# Patient Record
Sex: Male | Born: 1935
Health system: Southern US, Community
[De-identification: ages and names within clinical notes are randomized; demographics above are authoritative.]

## PROBLEM LIST (undated history)

## (undated) DIAGNOSIS — I8390 Asymptomatic varicose veins of unspecified lower extremity: Secondary | ICD-10-CM

## (undated) DIAGNOSIS — I872 Venous insufficiency (chronic) (peripheral): Secondary | ICD-10-CM

## (undated) DIAGNOSIS — I7090 Unspecified atherosclerosis: Secondary | ICD-10-CM

## (undated) HISTORY — DX: Venous insufficiency (chronic) (peripheral): I87.2

## (undated) HISTORY — DX: Asymptomatic varicose veins of unspecified lower extremity: I83.90

## (undated) HISTORY — DX: Unspecified atherosclerosis: I70.90

## (undated) HISTORY — PX: HERNIA REPAIR: SHX51

---

## 1974-01-15 HISTORY — PX: APPENDECTOMY: SHX54

## 2013-03-12 ENCOUNTER — Other Ambulatory Visit (HOSPITAL_COMMUNITY): Payer: Self-pay

## 2013-03-18 ENCOUNTER — Other Ambulatory Visit (HOSPITAL_COMMUNITY): Payer: Self-pay | Admitting: Family Medicine

## 2013-03-18 ENCOUNTER — Ambulatory Visit (HOSPITAL_COMMUNITY)
Admission: RE | Admit: 2013-03-18 | Discharge: 2013-03-18 | Disposition: A | Discharge status: Home / Self Care | Payer: Medicare Other | Source: Ambulatory Visit | Attending: Vascular Surgery | Admitting: Vascular Surgery

## 2013-03-18 DIAGNOSIS — I739 Peripheral vascular disease, unspecified: Secondary | ICD-10-CM

## 2013-03-26 ENCOUNTER — Other Ambulatory Visit: Payer: Self-pay | Admitting: *Deleted

## 2013-03-26 DIAGNOSIS — I739 Peripheral vascular disease, unspecified: Secondary | ICD-10-CM

## 2013-04-03 ENCOUNTER — Encounter: Payer: Self-pay | Admitting: Vascular Surgery

## 2013-04-15 ENCOUNTER — Encounter: Payer: Self-pay | Admitting: Vascular Surgery

## 2013-04-16 ENCOUNTER — Ambulatory Visit (INDEPENDENT_AMBULATORY_CARE_PROVIDER_SITE_OTHER): Payer: Medicare Other | Admitting: Vascular Surgery

## 2013-04-16 ENCOUNTER — Encounter: Payer: Self-pay | Admitting: Vascular Surgery

## 2013-04-16 ENCOUNTER — Ambulatory Visit (HOSPITAL_COMMUNITY)
Admission: RE | Admit: 2013-04-16 | Discharge: 2013-04-16 | Disposition: A | Discharge status: Home / Self Care | Payer: Medicare Other | Source: Ambulatory Visit | Attending: Vascular Surgery | Admitting: Vascular Surgery

## 2013-04-16 VITALS — BP 181/73 | HR 69 | Ht 71.0 in | Wt 188.0 lb

## 2013-04-16 DIAGNOSIS — I739 Peripheral vascular disease, unspecified: Secondary | ICD-10-CM

## 2013-04-16 NOTE — Progress Notes (Signed)
[facility] [place] HISTORY AND PHYSICAL   History of Present Illness:  Patient is a 80 y.o. year old male who presents for evaluation of left leg claudication. The patient is able to walk 2-3 blocks reports preventing tightness and cramping in his left calf. This is been the one on for approximately 3 months. He denies rest pain. He denies nonhealing wounds on the feet. He does have a history of varicose veins and has been evaluated by Dr. Michae Kava for this. Other medical problems include hypertension and borderline cholesterol.  He does not have a history of tobacco abuse or diabetes.  Past Medical History  Diagnosis Date  . Varicose veins   . Atherosclerotic vascular disease   . Venous insufficiency     History reviewed. No pertinent past surgical history.  Social History History  Substance Use Topics  . Smoking status: Never Smoker   . Smokeless tobacco: Never Used  . Alcohol Use: No    Family History Family History  Problem Relation Age of Onset  . Hypertension Mother   . Heart disease Father     before age 35  . Heart attack Father   . Heart disease Brother   . Hyperlipidemia Brother   . Heart attack Brother     Allergies  Allergies  Allergen Reactions  . Penicillins Hypertension  . Tetracyclines & Related Hives  . Vancomycin     Upset stomach     Current Outpatient Prescriptions  Medication Sig Dispense Refill  . Acetylcarnitine HCl (ACETYL-L-CARNITINE HCL) POWD by Does not apply route.      . ALBUTEROL IN Inhale into the lungs.      . Ascorbic Acid (VITAMIN C PO) Take by mouth.      . Carnitine-B5-B6 500-15-5 MG TABS Take 2 tablets by mouth daily.      . Cholecalciferol (VITAMIN D-3 PO) Take by mouth.      . Coenzyme Q10 (EQL COQ10) 300 MG CAPS Take by mouth daily.      . Fish Oil-Cholecalciferol (FISH OIL + D3 PO) Take by mouth.      Marland Kitchen GINKGO BILOBA PO Take by mouth.      Marland Kitchen MAGNESIUM PO Take by mouth.      . Menaquinone-7  (VITAMIN K2 PO) Take by mouth.      . Nattokinase 100 MG CAPS Take by mouth daily.      Marland Kitchen VITAMIN E PO Take by mouth.       No current facility-administered medications for this visit.    ROS:   General:  No weight loss, Fever, chills  HEENT: No recent headaches, no nasal bleeding, no visual changes, no sore throat  Neurologic: No dizziness, blackouts, seizures. No recent symptoms of stroke or mini- stroke. No recent episodes of slurred speech, or temporary blindness.  Cardiac: No recent episodes of chest pain/pressure, no shortness of breath at rest.  No shortness of breath with exertion.  Denies history of atrial fibrillation or irregular heartbeat  Vascular: No history of rest pain in feet.  + history of claudication.  No history of non-healing ulcer, No history of DVT   Pulmonary: No home oxygen, no productive cough, no hemoptysis,  No asthma or wheezing  Musculoskeletal:  [ ]  Arthritis, [ ]  Low back pain,  [ ]  Joint pain  Hematologic:No history of hypercoagulable state.  No history of easy bleeding.  No history of anemia  Gastrointestinal: No hematochezia or melena,  No gastroesophageal reflux, no trouble  swallowing  Urinary: [ ]  chronic Kidney disease, [ ]  on HD - [ ]  MWF or [ ]  TTHS, [ ]  Burning with urination, [ ]  Frequent urination, [ ]  Difficulty urinating;   Skin: No rashes  Psychological: No history of anxiety,  No history of depression   Physical Examination  Filed Vitals:   04/16/13 1125  BP: 181/73  Pulse: 69  Height: 5\' 11"  (1.803 m)  Weight: 188 lb (85.276 kg)  SpO2: 100%    Body mass index is 26.23 kg/(m^2).  General:  Alert and oriented, no acute distress HEENT: Normal Neck: No bruit or JVD Pulmonary: Clear to auscultation bilaterally Cardiac: Regular Rate and Rhythm without murmur Abdomen: Soft, non-tender, non-distended, no mass, no scars Skin: No rash Extremity Pulses:  2+ radial, brachial, 2+ right femoral, 1+ left femoral absent popliteal  dorsalis pedis, posterior tibial pulses bilaterally Musculoskeletal: No deformity or edema  Neurologic: Upper and lower extremity motor 5/5 and symmetric  DATA:  The patient had bilateral ABIs performed right was 0.7 left 0.5. I reviewed and interpreted this study.   ASSESSMENT:  Left lower extremity claudication with evidence of bilateral peripheral arterial occlusive disease lower extremities. I had a lengthy discussion with the patient today that overall his risk of amputation of his left lower extremity is extremely low less than 5%. This is especially in light of the fact he does not smoke or have diabetes. I also discussed with the patient today a walking program of 30 minutes daily to improve his walking distance overall. The other option I gave him was to consider an intervention with arteriogram possible angioplasty and stenting. He most likely has bilateral superficial femoral occlusive disease and possible left iliac occlusive disease   PLAN:  At this point the patient has opted for walking program. He will try to walk 30 minutes daily to improve his walking distance. If he changes his mind we will consider an arterial intervention at some point in the future. Otherwise he will followup with me with repeat ABIs and arterial duplex in 6 months time. Patient will continue his followup for his varicose veins with Dr. Wandra Mannan, MD [facility] [place] Office: [phone] Pager: [phone]

## 2013-10-15 ENCOUNTER — Encounter (HOSPITAL_COMMUNITY): Payer: Medicare Other

## 2013-10-15 ENCOUNTER — Ambulatory Visit: Payer: Medicare Other | Admitting: Vascular Surgery

## 2013-10-28 ENCOUNTER — Encounter: Payer: Self-pay | Admitting: Vascular Surgery

## 2013-10-29 ENCOUNTER — Encounter: Payer: Self-pay | Admitting: Vascular Surgery

## 2013-10-29 ENCOUNTER — Ambulatory Visit (INDEPENDENT_AMBULATORY_CARE_PROVIDER_SITE_OTHER): Payer: Medicare Other | Admitting: Vascular Surgery

## 2013-10-29 ENCOUNTER — Ambulatory Visit (HOSPITAL_COMMUNITY)
Admission: RE | Admit: 2013-10-29 | Discharge: 2013-10-29 | Disposition: A | Discharge status: Home / Self Care | Payer: Medicare Other | Source: Ambulatory Visit | Attending: Vascular Surgery | Admitting: Vascular Surgery

## 2013-10-29 VITALS — BP 167/66 | HR 65 | Ht 71.0 in | Wt 191.3 lb

## 2013-10-29 DIAGNOSIS — Z48812 Encounter for surgical aftercare following surgery on the circulatory system: Secondary | ICD-10-CM

## 2013-10-29 DIAGNOSIS — I739 Peripheral vascular disease, unspecified: Secondary | ICD-10-CM | POA: Insufficient documentation

## 2013-10-29 NOTE — Addendum Note (Signed)
Addended by: Mena Goes on: 10/29/2013 04:55 PM   Modules accepted: Orders

## 2013-10-29 NOTE — Progress Notes (Addendum)
HISTORY AND PHYSICAL     CC:  F/u for PAD  Earney Mallet, MD  HPI: This is a 80 y.o. male who presents today for evaluation of his PAD.  He states that he can walk about 0.29mi before he has to stop and rest and the pain goes away.  His pain is worse in his left leg.  He denies rest pain and does not have a hx of non healing wounds.  He states that he does have an ingrown toenail on his right foot and is inquiring about a podiatrist.  He does not take medication for HTN or cholesterol.  He states that he lives healthy and takes supplements and eats a Mediterranean diet.  He has never smoked.    Past Medical History  Diagnosis Date  . Varicose veins   . Atherosclerotic vascular disease   . Venous insufficiency    No past surgical history on file.  Allergies  Allergen Reactions  . Penicillins Hypertension  . Tetracyclines & Related Hives  . Vancomycin     Upset stomach    Current Outpatient Prescriptions  Medication Sig Dispense Refill  . Acetylcarnitine HCl (ACETYL-L-CARNITINE HCL) POWD by Does not apply route.      . ALBUTEROL IN Inhale into the lungs.      . Ascorbic Acid (VITAMIN C PO) Take by mouth.      . Carnitine-B5-B6 500-15-5 MG TABS Take 2 tablets by mouth daily.      . Cholecalciferol (VITAMIN D-3 PO) Take by mouth.      . Coenzyme Q10 (EQL COQ10) 300 MG CAPS Take by mouth daily.      . Fish Oil-Cholecalciferol (FISH OIL + D3 PO) Take by mouth.      Marland Kitchen GINKGO BILOBA PO Take by mouth.      Marland Kitchen MAGNESIUM PO Take by mouth.      . Menaquinone-7 (VITAMIN K2 PO) Take by mouth.      . Nattokinase 100 MG CAPS Take by mouth daily.      Marland Kitchen VITAMIN E PO Take by mouth.       No current facility-administered medications for this visit.    Family History  Problem Relation Age of Onset  . Hypertension Mother   . Heart disease Mother   . Heart disease Father     before age 50  . Heart attack Father   . Heart disease Brother   . Hyperlipidemia Brother   . Heart attack  Brother     History   Social History  . Marital Status: Married    Spouse Name: N/A    Number of Children: N/A  . Years of Education: N/A   Occupational History  . Not on file.   Social History Main Topics  . Smoking status: Never Smoker   . Smokeless tobacco: Never Used  . Alcohol Use: No  . Drug Use: No  . Sexual Activity: Not on file   Other Topics Concern  . Not on file   Social History Narrative  . No narrative on file     ROS: [x]  Positive   [ ]  Negative   [ ]  All sytems reviewed and are negative  Cardiovascular: []  chest pain/pressure []  palpitations []  SOB lying flat []  DOE [x]  pain in legs while walking []  pain in feet when lying flat []  hx of DVT []  hx of phlebitis []  swelling in legs []  varicose veins  Pulmonary: []  productive cough [x]  asthma []  wheezing  Neurologic: []   weakness in []  arms []  legs []  numbness in []  arms []  legs [] difficulty speaking or slurred speech []  temporary loss of vision in one eye []  dizziness  Hematologic: []  bleeding problems []  problems with blood clotting easily  GI []  vomiting blood []  blood in stool  GU: []  burning with urination []  blood in urine  Psychiatric: []  hx of major depression  Integumentary: []  rashes []  ulcers  Constitutional: []  fever []  chills   PHYSICAL EXAMINATION:  Filed Vitals:   10/29/13 1601  BP: 167/66  Pulse: 65   Body mass index is 26.69 kg/(m^2).  General:  WDWN in NAD Gait: Not observed HENT: WNL, normocephalic Eyes: Pupils equal Pulmonary: normal non-labored breathing , without Rales, rhonchi,  wheezing Cardiac: RRR, without  Murmurs, rubs or gallops; without carotid bruits Abdomen: soft, NT, no masses Skin: without rashes, without ulcers  Vascular Exam/Pulses:  Right Left  Radial 2+ (normal) 2+ (normal)  Ulnar 1+ (weak) 1+ (weak)  Femoral 2+ (normal) 2+ (normal)  Popliteal Unable to palpate  Unable to palpate   DP Unable to palpate  Unable to  palpate   PT Unable to palpate  Unable to palpate    Extremities: without ischemic changes, without Gangrene , without cellulitis; without open wounds;  Musculoskeletal: no muscle wasting or atrophy  Neurologic: A&O X 3; Appropriate Affect ; SENSATION: normal; MOTOR FUNCTION:  moving all extremities equally. Speech is fluent/normal   Non-Invasive Vascular Imaging:   ABI's 10/29/13 Right:  0.79 Left:  0.41  Previous ABI's 04/16/13: Right:  0.69 Left:  0.41  Pt meds includes: Statin:  No. Beta Blocker:  No. Aspirin:  No. ACEI:  No. ARB:  No. Other Antiplatelet/Anticoagulant:  No.   ASSESSMENT/PLAN:: 80 y.o. male with PAD with claudication   -pt is doing well and his ABI's are essentially unchanged. -he will continue walk daily as well as his yard work -Dr. Oneida Alar did discuss with him the option of arteriogram with possible intervention.  The pt would like to defer that at this time and continue his current regimen.   -we will refer him to a podiatrist for foot care -he is to f/u in 6 months with ABI's--he understands that if he develops non healing wounds before that time, he will contact us.   Leontine Locket, PA-C [facility] [phone]  Clinic MD:  Pt seen and examined in conjunction with Dr. Oneida Alar  History and exam details as above. The patient does not have palpable pedal pulses. He does have thickened nails and needs evaluation by podiatry. Overall he is currently satisfied with his walking distance. He is able to do chores around the house and does yardwork without significant difficulty.  We'll repeat his ABIs in 6 months. He will followup sooner if he wishes to have an arteriogram performed or if his symptoms are progressive.  Ruta Hinds, MD [facility] [place] Office: [phone] Pager: [phone]

## 2013-10-30 NOTE — Addendum Note (Signed)
Addended by: Mena Goes on: 10/30/2013 10:54 AM   Modules accepted: Orders

## 2013-11-12 ENCOUNTER — Ambulatory Visit (INDEPENDENT_AMBULATORY_CARE_PROVIDER_SITE_OTHER): Payer: Medicare Other | Admitting: Podiatry

## 2013-11-12 ENCOUNTER — Encounter: Payer: Self-pay | Admitting: Podiatry

## 2013-11-12 VITALS — BP 175/88 | HR 68 | Resp 16 | Ht 72.0 in | Wt 191.0 lb

## 2013-11-12 DIAGNOSIS — M2012 Hallux valgus (acquired), left foot: Secondary | ICD-10-CM

## 2013-11-12 DIAGNOSIS — I739 Peripheral vascular disease, unspecified: Secondary | ICD-10-CM

## 2013-11-12 DIAGNOSIS — M79606 Pain in leg, unspecified: Secondary | ICD-10-CM

## 2013-11-12 DIAGNOSIS — B351 Tinea unguium: Secondary | ICD-10-CM

## 2013-11-12 DIAGNOSIS — M21612 Bunion of left foot: Secondary | ICD-10-CM

## 2013-11-12 NOTE — Progress Notes (Signed)
   Subjective:    Patient ID: Spencer Todd, male    DOB: 11/15/1932, 80 y.o.   MRN: [id number]  HPI Comments: Toenails are thick and painful i think i have some ingrown toenails, i guess i let them go too long and they are growing down into the skin. Just found out i have pad      Review of Systems  Genitourinary: Positive for frequency.  All other systems reviewed and are negative.      Objective:   Physical Exam: I have reviewed his past medical history medications allergies are social history and review of systems. Pulses are nonpalpable bilateral. Venous distention is noted. Capillary fill time to digits 1 through 5 bilateral is immediate. His feet are warm to the touch. Muscle strength was 5 over 5 dorsiflexion plantar flexion his inverters and everters all his musculature is intact. Neurologic sensorium is intact for Semmes-Weinstein monofilament. Deep tendon reflexes intact bilateral. Orthopedic evaluation demonstrates mild hammertoe deformities with osteoarthritis bilateral. Cutaneous evaluation was a supple well-hydrated uterus with exception of the dry xerotic skin with skin plantar aspect of the bilateral foot possibly associated with tinea pedis. His nails are thick yellow dystrophic with mycotic and painful palpation.        Assessment & Plan:  Assessment: Pain in limb secondary to onychomycosis and peripheral arterial disease.  Plan: He is already being seen by vascular surgery so at this point we debrided nails 1 through 5 bilateral is covered service secondary to pain he will follow up with [place] as needed.

## 2014-04-29 ENCOUNTER — Ambulatory Visit (INDEPENDENT_AMBULATORY_CARE_PROVIDER_SITE_OTHER): Payer: Medicare Other | Admitting: Podiatry

## 2014-04-29 ENCOUNTER — Encounter: Payer: Self-pay | Admitting: Podiatrist

## 2014-04-29 DIAGNOSIS — B351 Tinea unguium: Secondary | ICD-10-CM

## 2014-04-29 DIAGNOSIS — M79606 Pain in leg, unspecified: Secondary | ICD-10-CM | POA: Diagnosis not present

## 2014-04-29 NOTE — Progress Notes (Signed)
he presents today with a chief complaint of painful elongated toenails.  Objective: Pulses are palpable bilateral. Nails are thick yellow dystrophic with mycotic painful palpation.  Assessment: Pain in limb secondary to onychomycosis.  Plan: Debridement of nails 1 through 5 bilateral.

## 2014-05-06 ENCOUNTER — Other Ambulatory Visit (HOSPITAL_COMMUNITY): Payer: Medicare Other

## 2014-05-06 ENCOUNTER — Ambulatory Visit: Payer: Medicare Other | Admitting: Vascular Surgery

## 2014-05-06 ENCOUNTER — Encounter (HOSPITAL_COMMUNITY): Payer: Medicare Other

## 2014-05-26 ENCOUNTER — Encounter: Payer: Self-pay | Admitting: Vascular Surgery

## 2014-05-27 ENCOUNTER — Encounter: Payer: Self-pay | Admitting: Vascular Surgery

## 2014-05-27 ENCOUNTER — Ambulatory Visit (INDEPENDENT_AMBULATORY_CARE_PROVIDER_SITE_OTHER): Payer: Medicare Other | Admitting: Vascular Surgery

## 2014-05-27 ENCOUNTER — Ambulatory Visit (HOSPITAL_COMMUNITY)
Admission: RE | Admit: 2014-05-27 | Discharge: 2014-05-27 | Disposition: A | Discharge status: Home / Self Care | Payer: Medicare Other | Source: Ambulatory Visit | Attending: Vascular Surgery | Admitting: Vascular Surgery

## 2014-05-27 VITALS — BP 174/90 | HR 57 | Resp 14 | Ht 71.0 in | Wt 194.0 lb

## 2014-05-27 DIAGNOSIS — I739 Peripheral vascular disease, unspecified: Secondary | ICD-10-CM | POA: Diagnosis not present

## 2014-05-27 DIAGNOSIS — Z48812 Encounter for surgical aftercare following surgery on the circulatory system: Secondary | ICD-10-CM | POA: Diagnosis not present

## 2014-05-27 DIAGNOSIS — I10 Essential (primary) hypertension: Secondary | ICD-10-CM | POA: Diagnosis not present

## 2014-05-27 DIAGNOSIS — I70219 Atherosclerosis of native arteries of extremities with intermittent claudication, unspecified extremity: Secondary | ICD-10-CM | POA: Diagnosis not present

## 2014-05-27 NOTE — Progress Notes (Addendum)
HISTORY AND PHYSICAL     CC:  PAD Referring Provider:  Earney Mallet, MD  HPI: This is a 81 y.o. male who presents today for his 6 month check up for PAD.  He states that he is actually walking farther than the last time he was in 6 months ago.  His left leg is worse.  He denies any non healing wounds to either of his feet.  He was referred to a podiatrist at the last visit.  He states that his bunions on his feet bother him usually after wearing his dress shoes all day for church.  Otherwise, he has no complaints.  He states that he had a bleeding varicose vein in the recent past and stopped after pressure was applied.    He has never smoked.  He eats a healthy Mediterranean diet.  He does have an inhaler for his asthma.  He does not take any prescription medications, but does take vitamin supplements.    Past Medical History  Diagnosis Date  . Varicose veins   . Atherosclerotic vascular disease   . Venous insufficiency     No past surgical history on file.  Allergies  Allergen Reactions  . Penicillins Hypertension  . Tetracyclines & Related Hives  . Vancomycin     Upset stomach    Current Outpatient Prescriptions  Medication Sig Dispense Refill  . Acetylcarnitine HCl (ACETYL-L-CARNITINE HCL) POWD by Does not apply route.    . ALBUTEROL IN Inhale into the lungs.    . Ascorbic Acid (VITAMIN C PO) Take by mouth.    . Carnitine-B5-B6 500-15-5 MG TABS Take 2 tablets by mouth daily.    . Cholecalciferol (VITAMIN D-3 PO) Take by mouth.    . Coenzyme Q10 (EQL COQ10) 300 MG CAPS Take by mouth daily.    . Fish Oil-Cholecalciferol (FISH OIL + D3 PO) Take by mouth.    Marland Kitchen GINKGO BILOBA PO Take by mouth.    . GLYCINE PO Take by mouth daily.    Marland Kitchen MAGNESIUM PO Take by mouth.    . Menaquinone-7 (VITAMIN K2 PO) Take by mouth.    . Nattokinase 100 MG CAPS Take by mouth daily.    Marland Kitchen VITAMIN E PO Take by mouth.     No current facility-administered medications for this visit.     Family History  Problem Relation Age of Onset  . Hypertension Mother   . Heart disease Mother   . Heart disease Father     before age 55  . Heart attack Father   . Heart disease Brother   . Hyperlipidemia Brother   . Heart attack Brother     History   Social History  . Marital Status: Married    Spouse Name: N/A  . Number of Children: N/A  . Years of Education: N/A   Occupational History  . Not on file.   Social History Main Topics  . Smoking status: Never Smoker   . Smokeless tobacco: Never Used  . Alcohol Use: No  . Drug Use: No  . Sexual Activity: Not on file   Other Topics Concern  . Not on file   Social History Narrative     ROS: [x]  Positive   [ ]  Negative   [ ]  All sytems reviewed and are negative  Cardiovascular: []  chest pain/pressure []  palpitations []  SOB lying flat []  DOE [x]  pain in legs while walking-intermittent claudication []  pain in feet when lying flat []  hx of DVT []  hx  of phlebitis []  swelling in legs []  varicose veins  Pulmonary: []  productive cough [x]  asthma-under control []  wheezing  Neurologic: []  weakness in []  arms []  legs []  numbness in []  arms []  legs [] difficulty speaking or slurred speech []  temporary loss of vision in one eye []  dizziness  Hematologic: []  bleeding problems []  problems with blood clotting easily  GI []  vomiting blood []  blood in stool  GU: []  burning with urination []  blood in urine  Psychiatric: []  hx of major depression  Integumentary: []  rashes []  ulcers  Constitutional: []  fever []  chills   PHYSICAL EXAMINATION:  Filed Vitals:   05/27/14 1502  BP: 174/90  Pulse: 57  Resp:    Body mass index is 27.07 kg/(m^2).  General:  WDWN in NAD Gait: Not observed HENT: WNL, normocephalic Pulmonary: normal non-labored breathing , without Rales, rhonchi,  wheezing Cardiac: RRR, without  Murmurs, rubs or gallops; without carotid bruits Abdomen: soft, NT, no masses Skin:  without rashes, without ulcers  Vascular Exam/Pulses:  Right Left  Radial 2+ (normal) 2+ (normal)  Ulnar 2+ (normal) 2+ (normal)  Femoral 2+ (normal) 2+ (normal)  Popliteal trace absent  DP absent absent  PT absent absent   Extremities: without ischemic changes, without Gangrene , without cellulitis; without open wounds; varicose veins bilaterally with left > right Musculoskeletal: no muscle wasting or atrophy  Neurologic: A&O X 3; Appropriate Affect ; SENSATION: normal; MOTOR FUNCTION:  moving all extremities equally. Speech is fluent/normal   Non-Invasive Vascular Imaging:   Today's ABI's: Right:  0.81 Left:  0.49  Today's TBI's: Right:  0.76 Left:  0.13  Previous ABI's: ABI's 10/29/13 Right: 0.79 Left: 0.41  Previous ABI's 04/16/13: Right: 0.69 Left: 0.41   Pt meds includes: Statin:  No. Beta Blocker:  No. Aspirin:  No. ACEI:  No. ARB:  No. Other Antiplatelet/Anticoagulant:  No.    ASSESSMENT/PLAN:: 81 y.o. male with PAD and intermittent claudication that is not lifestyle limiting   -pt is doing well and actually able to walk farther than at his last visit.   -he will continue his walking regimen -pt states he does have bunions bilaterally that do bother him occasionally.  The podiatrist states he would need surgery to treat them.  However, if he needs surgery on his right foot, he would need to proceed with arteriogram and possible intervention/bypass before proceeding with bunionectomy as this would have potential of not healing and becoming a limb threatening situation.  This was discussed with the pt and he and his wife express understanding. -he will f/u in 6 months with ABI's.  He will see [place] sooner if he has any issues.  -he did have a bleeding varicosity in the recent past.   He is advised if this happens again, to apply pressure and leg elevation to decrease the pressure.     Leontine Locket, PA-C [facility] [phone]  Clinic  MD:  Pt seen and examined in conjunction with Dr. Oneida Alar  History and exam findings as above. Patient has stable peripheral arterial disease and is overall fairly active. If he needed any intervention on his foot he would most likely need revascularization but currently he is not planning on any events for his foot. Otherwise he has stable peripheral arterial disease and is not currently at risk of limb loss. He will follow-up with me in 6 months time.  Ruta Hinds, MD [facility] [place] Office: [phone] Pager: [phone]

## 2014-05-27 NOTE — Progress Notes (Signed)
Filed Vitals:   05/27/14 1455 05/27/14 1502  BP: 154/83 174/90  Pulse: 60 57  Resp: 14   Height: 5\' 11"  (1.803 m)   Weight: 194 lb (87.998 kg)     Body mass index is 27.07 kg/(m^2).

## 2014-05-28 ENCOUNTER — Other Ambulatory Visit: Payer: Self-pay | Admitting: *Deleted

## 2014-05-28 DIAGNOSIS — I70219 Atherosclerosis of native arteries of extremities with intermittent claudication, unspecified extremity: Secondary | ICD-10-CM

## 2014-08-12 ENCOUNTER — Ambulatory Visit: Payer: Medicare Other | Admitting: Podiatry

## 2014-12-02 ENCOUNTER — Ambulatory Visit (HOSPITAL_COMMUNITY)
Admission: RE | Admit: 2014-12-02 | Discharge: 2014-12-02 | Disposition: A | Discharge status: Home / Self Care | Payer: Medicare Other | Source: Ambulatory Visit | Attending: Vascular Surgery | Admitting: Vascular Surgery

## 2014-12-02 ENCOUNTER — Ambulatory Visit (INDEPENDENT_AMBULATORY_CARE_PROVIDER_SITE_OTHER): Payer: Medicare Other | Admitting: Vascular Surgery

## 2014-12-02 ENCOUNTER — Encounter: Payer: Self-pay | Admitting: Vascular Surgery

## 2014-12-02 ENCOUNTER — Ambulatory Visit: Payer: Medicare Other | Admitting: Vascular Surgery

## 2014-12-02 VITALS — BP 188/84 | HR 61 | Ht 71.0 in | Wt 190.0 lb

## 2014-12-02 DIAGNOSIS — I70219 Atherosclerosis of native arteries of extremities with intermittent claudication, unspecified extremity: Secondary | ICD-10-CM

## 2014-12-02 DIAGNOSIS — I739 Peripheral vascular disease, unspecified: Secondary | ICD-10-CM

## 2014-12-02 NOTE — Progress Notes (Signed)
HISTORY AND PHYSICAL     CC:  PAD Referring Provider:  Earney Mallet, MD  HPI: This is a 81 y.o. male who presents today for his 6 month check up for PAD.  He states that he is actually walking farther than the last time he was in 6 months ago.  His left leg is worse.  He denies any non healing wounds to either of his feet.  He was referred to a podiatrist at the last visit.  He states that his bunions on his feet bother him usually after wearing his dress shoes all day for church.  Otherwise, he has no complaints.  He states that he had a bleeding varicose vein in the recent past and stopped after pressure was applied.    He has never smoked.  He eats a healthy Mediterranean diet.  He does have an inhaler for his asthma.  He does not take any prescription medications, but does take vitamin supplements.      Past Medical History   Diagnosis  Date   .  Varicose veins     .  Atherosclerotic vascular disease     .  Venous insufficiency       No past surgical history on file.    Allergies   Allergen  Reactions   .  Penicillins  Hypertension   .  Tetracyclines & Related  Hives   .  Vancomycin         Upset stomach       Current Outpatient Prescriptions   Medication  Sig  Dispense  Refill   .  Acetylcarnitine HCl (ACETYL-L-CARNITINE HCL) POWD  by Does not apply route.       .  ALBUTEROL IN  Inhale into the lungs.       .  Ascorbic Acid (VITAMIN C PO)  Take by mouth.       .  Carnitine-B5-B6 500-15-5 MG TABS  Take 2 tablets by mouth daily.       .  Cholecalciferol (VITAMIN D-3 PO)  Take by mouth.       .  Coenzyme Q10 (EQL COQ10) 300 MG CAPS  Take by mouth daily.       .  Fish Oil-Cholecalciferol (FISH OIL + D3 PO)  Take by mouth.       Marland Kitchen  GINKGO BILOBA PO  Take by mouth.       .  GLYCINE PO  Take by mouth daily.       Marland Kitchen  MAGNESIUM PO  Take by mouth.       .  Menaquinone-7 (VITAMIN K2 PO)  Take by mouth.       .  Nattokinase 100 MG CAPS  Take by mouth daily.       Marland Kitchen  VITAMIN  E PO  Take by mouth.          No current facility-administered medications for this visit.       Family History   Problem  Relation  Age of Onset   .  Hypertension  Mother     .  Heart disease  Mother     .  Heart disease  Father         before age 86   .  Heart attack  Father     .  Heart disease  Brother     .  Hyperlipidemia  Brother     .  Heart attack  Brother  History      Social History   .  Marital Status:  Married       Spouse Name:  N/A   .  Number of Children:  N/A   .  Years of Education:  N/A      Occupational History   .  Not on file.      Social History Main Topics   .  Smoking status:  Never Smoker    .  Smokeless tobacco:  Never Used   .  Alcohol Use:  No   .  Drug Use:  No   .  Sexual Activity:  Not on file      Other Topics  Concern   .  Not on file      Social History Narrative     Review of systems: He denies shortness of breath. He denies chest pain.  PHYSICAL EXAMINATION:    Filed Vitals:   12/02/14 1521 12/02/14 1523  BP: 182/81 188/84  Pulse: 61   Height: 5\' 11"  (1.803 m)   Weight: 190 lb (86.183 kg)   SpO2: 95%     General:  WDWN in NAD Pulmonary: normal non-labored breathing , without Rales, rhonchi,  wheezing Cardiac: RRR, without  Murmurs, rubs or gallops; without carotid bruits Abdomen: soft, NT, no masses Skin: without rashes, without ulcers  Vascular Exam/Pulses: Absent left femoral popliteal and pedal pulses, 2+ right femoral pulse 2+ right posterior tibial pulse Extremities: without ischemic changes, without Gangrene , without cellulitis; without open wounds; varicose veins bilaterally with left > right Musculoskeletal: no muscle wasting or atrophy       Neurologic: A&O X 3; Appropriate Affect ; SENSATION: normal; MOTOR FUNCTION:  moving all extremities equally. Speech is fluent/normal   Non-Invasive Vascular Imaging:   Today's ABI's: Right:  0.74 Left:  0.46  Previous ABI's: ABI's 10/29/13 Right:   0.79 Left:  0.41  Previous ABI's 04/16/13: Right:  0.69 Left:  0.41   Pt meds includes: Statin:  No. Beta Blocker:  No. Aspirin:  No. ACEI:  No. ARB:  No. Other Antiplatelet/Anticoagulant:  No.    ASSESSMENT/PLAN:: 81 y.o. male with PAD and intermittent claudication that is not lifestyle limiting  Patient has stable peripheral arterial disease and is overall fairly active. If he needed any intervention on his foot or developed a wound on his left foot he would most likely need revascularization but currently he is not planning on any events for his foot. Otherwise he has stable peripheral arterial disease and is not currently at risk of limb loss. He will follow-up with me in 1 year with repeat ABIs at that time  Ruta Hinds, MD [facility]: [phone] Pager: [phone]

## 2014-12-03 NOTE — Addendum Note (Signed)
Addended by: Dorthula Rue L on: 12/03/2014 09:06 AM   Modules accepted: Orders

## 2014-12-16 ENCOUNTER — Ambulatory Visit: Payer: Medicare Other | Admitting: Vascular Surgery

## 2015-03-10 ENCOUNTER — Telehealth: Payer: Self-pay

## 2015-03-10 NOTE — Telephone Encounter (Signed)
Phone call from pt.  Reported he had an episode of bleeding from a vein in his left lower leg.  Stated there is "very thin skin" on his lower leg, and it "bled very forcefully" last night.  Stated he had his wife to wrap it in a towel, and it eventually stopped.  Stated this happened once before, about 1 year ago.  Questioned how to take care of it?  Advised if it happens again, to apply direct, steady pressure over the area that is bleeding, with 1-2 fingers, for about 5-10 minutes.  Questioned how to prevent the area from getting infected?  Advised he can cleanse with antibacterial soap and water, and apply a triple antibiotic oint. to site, and cover with telfa dressing.  Advised to change the bandage daily, and to leave open to air, after the skin closes.  Encouraged to call the office if the bleeding becomes a recurrent problem, and will poss. need to get study to determine if he has Venous Reflux.  Pt. verb. understanding.

## 2015-03-29 ENCOUNTER — Encounter (INDEPENDENT_AMBULATORY_CARE_PROVIDER_SITE_OTHER): Payer: Medicare Other | Admitting: Podiatry

## 2015-03-29 NOTE — Progress Notes (Signed)
This encounter was created in error - please disregard.

## 2015-04-08 ENCOUNTER — Telehealth: Payer: Self-pay | Admitting: Vascular Surgery

## 2015-04-08 ENCOUNTER — Telehealth: Payer: Self-pay | Admitting: *Deleted

## 2015-04-08 NOTE — Telephone Encounter (Signed)
Patient called to triage regarding occasional bleeding from the VV in is LE. Dr. Oneida Alar stated in his last note that if this became worrisome, he would get patient in with our [facility], either Dr. Donnetta Hutching or Dr Kellie Simmering.    LMTCB and discuss bleeding control hints. Will have our office call him to schedule BLE reflux studies and appt with Vein doctor asap.

## 2015-04-08 NOTE — Telephone Encounter (Signed)
notified patient of appointment on 07-26-15 at 1:30 for lab then 2:30 with jdl

## 2015-04-14 ENCOUNTER — Ambulatory Visit: Payer: Medicare Other | Admitting: Podiatry

## 2015-04-19 ENCOUNTER — Ambulatory Visit (INDEPENDENT_AMBULATORY_CARE_PROVIDER_SITE_OTHER): Payer: Medicare Other | Admitting: Podiatry

## 2015-04-19 ENCOUNTER — Encounter: Payer: Self-pay | Admitting: Podiatry

## 2015-04-19 VITALS — BP 188/90 | HR 67 | Resp 16

## 2015-04-19 DIAGNOSIS — B351 Tinea unguium: Secondary | ICD-10-CM

## 2015-04-19 DIAGNOSIS — M79606 Pain in leg, unspecified: Secondary | ICD-10-CM

## 2015-04-19 NOTE — Progress Notes (Signed)
He presents today with a chief complaint of painful elongated toenails.   Objective: Vital signs are stable alert and oriented 3. Pulses are palpable. Toenails are thick yellow dystrophic onychomycotic and painful on palpation.  Assessment: Pain limb secondary to onychomycosis.  Plan: Debridement of toenails 1 through 5 bilateral.

## 2015-07-15 ENCOUNTER — Encounter: Payer: Self-pay | Admitting: Vascular Surgery

## 2015-07-21 ENCOUNTER — Other Ambulatory Visit: Payer: Self-pay | Admitting: *Deleted

## 2015-07-21 ENCOUNTER — Ambulatory Visit: Payer: Medicare Other | Admitting: Podiatry

## 2015-07-21 DIAGNOSIS — I83893 Varicose veins of bilateral lower extremities with other complications: Secondary | ICD-10-CM

## 2015-07-26 ENCOUNTER — Encounter (HOSPITAL_COMMUNITY): Payer: Medicare Other

## 2015-07-26 ENCOUNTER — Encounter: Payer: Self-pay | Admitting: Vascular Surgery

## 2015-07-26 ENCOUNTER — Ambulatory Visit (INDEPENDENT_AMBULATORY_CARE_PROVIDER_SITE_OTHER): Payer: Medicare Other | Admitting: Vascular Surgery

## 2015-07-26 ENCOUNTER — Ambulatory Visit (HOSPITAL_COMMUNITY)
Admission: RE | Admit: 2015-07-26 | Discharge: 2015-07-26 | Disposition: A | Discharge status: Home / Self Care | Payer: Medicare Other | Source: Ambulatory Visit | Attending: Vascular Surgery | Admitting: Vascular Surgery

## 2015-07-26 ENCOUNTER — Encounter: Payer: Medicare Other | Admitting: Vascular Surgery

## 2015-07-26 VITALS — BP 158/76 | HR 60 | Temp 98.0°F | Resp 18 | Ht 71.0 in | Wt 185.0 lb

## 2015-07-26 DIAGNOSIS — I83893 Varicose veins of bilateral lower extremities with other complications: Secondary | ICD-10-CM

## 2015-07-26 DIAGNOSIS — I8392 Asymptomatic varicose veins of left lower extremity: Secondary | ICD-10-CM | POA: Insufficient documentation

## 2015-07-26 DIAGNOSIS — I83892 Varicose veins of left lower extremities with other complications: Secondary | ICD-10-CM

## 2015-07-26 NOTE — Progress Notes (Signed)
Filed Vitals:   07/26/15 1440 07/26/15 1442  BP: 162/77 158/76  Pulse: 60 60  Temp: 98 F (36.7 C)   Resp: 18   Height: 5\' 11"  (1.803 m)   Weight: 185 lb (83.915 kg)   SpO2: 97%

## 2015-07-26 NOTE — Progress Notes (Signed)
Subjective:     Patient ID: Spencer Todd, male   DOB: Sep 07, 1932, 82 y.o.   MRN: [id number]  HPI this 82 year old male was evaluated for painful varicosities in the left leg with 2 recent episodes of spontaneous bleeding from a varix near the left ankle. Patient has been seen in the past by Dr. 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 Patient has limiting claudication but is able to ambulate several blocks. Over the past 3 months he's had 2 episodes of significant bleeding from a varix in the left lower leg. He has chronic skin changes in the left leg and chronic swelling and has aching throbbing and burning discomfort in varicosities particularly below the knee on the left. He has no symptoms in the right leg. He has no history of stasis ulcers. He was evaluated by [facility] 2 years ago and started wearing elastic compression stockings at that time which she has continued.  Past Medical History  Diagnosis Date  . Varicose veins   . Atherosclerotic vascular disease   . Venous insufficiency     Social History  Substance Use Topics  . Smoking status: Never Smoker   . Smokeless tobacco: Never Used  . Alcohol Use: No    Family History  Problem Relation Age of Onset  . Hypertension Mother   . Heart disease Mother   . Heart disease Father     before age 71  . Heart attack Father   . Heart disease Brother   . Hyperlipidemia Brother   . Heart attack Brother     Allergies  Allergen Reactions  . Penicillins Hypertension  . Tetracyclines & Related Hives  . Vancomycin     Upset stomach     Current outpatient prescriptions:  .  Acetylcarnitine HCl (ACETYL-L-CARNITINE HCL) POWD, by Does not apply route., Disp: , Rfl:  .  ALBUTEROL IN, Inhale into the lungs., Disp: , Rfl:  .  Ascorbic Acid (VITAMIN C PO), Take by mouth., Disp: , Rfl:  .  Carnitine-B5-B6 500-15-5 MG TABS, Take 2 tablets by mouth daily., Disp: , Rfl:  .  Cholecalciferol  (VITAMIN D-3 PO), Take by mouth., Disp: , Rfl:  .  Coenzyme Q10 (EQL COQ10) 300 MG CAPS, Take by mouth daily., Disp: , Rfl:  .  Fish Oil-Cholecalciferol (FISH OIL + D3 PO), Take by mouth., Disp: , Rfl:  .  GINKGO BILOBA PO, Take by mouth., Disp: , Rfl:  .  GLYCINE PO, Take by mouth daily., Disp: , Rfl:  .  MAGNESIUM PO, Take by mouth., Disp: , Rfl:  .  Menaquinone-7 (VITAMIN K2 PO), Take by mouth., Disp: , Rfl:  .  Nattokinase 100 MG CAPS, Take by mouth daily., Disp: , Rfl:  .  VITAMIN E PO, Take by mouth., Disp: , Rfl:   Filed Vitals:   07/26/15 1440 07/26/15 1442  BP: 162/77 158/76  Pulse: 60 60  Temp: 98 F (36.7 C)   Resp: 18   Height: 5\' 11"  (1.803 m)   Weight: 185 lb (83.915 kg)   SpO2: 97%     Body mass index is 25.81 kg/(m^2).           Review of Systems denies chest pain, dyspnea on exertion, PND, orthopnea, hemoptysis. Has claudication left calf after walking about one half mile.     Objective:   Physical Exam BP 158/76 mmHg  Pulse 60  Temp(Src) 98 F (36.7 C)  Resp 18  Ht  5\' 11"  (1.803 m)  Wt 185 lb (83.915 kg)  BMI 25.81 kg/m2  SpO2 97%  Gen. well-developed well-nourished male no apparent distress alert and oriented 3 Lungs no rhonchi or wheezing Left leg with bulging varicosities from the knee to the medial malleolus. Hyperpigmentation lower third left leg with eschar overlying large varix which is the site of 2 previous bleeds in the last few months. Appears to be a superficial ulceration beneath the eschar. Left foot is up adequately perfused.  Today I ordered a venous duplex exam the left leg which I reviewed and interpreted. There is no DVT. There is gross reflux throughout a large left great saphenous vein from the saphenofemoral junction to the knee supplying these painful varicosities and where the bleed occurred Also performed a independent bedside sono site exam confirming the above findings. The left great saphenous vein is large in caliber  with gross reflux     Assessment:     Gross reflux left great saphenous vein with 2 episodes of spontaneous bleeding from superficial varix and left lower leg. Patient has chronic discomfort and swelling and left leg and symptoms are affecting his daily living and he is very concerned about further bleeding PAD with stable left leg claudication due to superficial femoral occlusion      Plan:     Patient needs laser ablation left great saphenous vein because of 2 recent episodes of bleeding and severe venous hypertension and potential for stasis ulcer With left superficial femoral occlusion patient would likely not do well healing venous stasis ulcers this occurs Do not believe vein would be a satisfactory conduit for revascularization because of its large caliber and patient does have a contralateral right great saphenous vein if needed later  Believe we should wave the three-month waiting period and proceed with laser ablation left great saphenous vein with sclerotherapy of the bleeding site as soon as we can get this approved

## 2015-08-30 ENCOUNTER — Ambulatory Visit: Payer: Medicare Other | Admitting: Podiatry

## 2015-09-08 ENCOUNTER — Ambulatory Visit (INDEPENDENT_AMBULATORY_CARE_PROVIDER_SITE_OTHER): Payer: Medicare Other | Admitting: Podiatry

## 2015-09-08 ENCOUNTER — Encounter: Payer: Self-pay | Admitting: Podiatry

## 2015-09-08 DIAGNOSIS — M79676 Pain in unspecified toe(s): Secondary | ICD-10-CM | POA: Diagnosis not present

## 2015-09-08 DIAGNOSIS — B351 Tinea unguium: Secondary | ICD-10-CM

## 2015-09-08 NOTE — Progress Notes (Signed)
He presents today with chief complaint of painful elongated toenails.  Objective: Toenails are thick yellow dystrophic with mycotic painful palpation sharply. Sharp and radial nail margins.  Assessment: Pain in limb secondary onychomycosis 1 through 5 of the right foot.  Plan: Debrided nails 1 through 5 bilateral covered service secondary to pain. Reevaluate pulses to the right foot next time he consider matrixectomy tibial border hallux right.

## 2015-09-29 ENCOUNTER — Telehealth: Payer: Self-pay | Admitting: *Deleted

## 2015-09-29 NOTE — Telephone Encounter (Signed)
Returning Mr. Spencer Todd earlier phone message regarding a left leg venous bleeding episode on 09-28-2015.  Spencer Todd was seen by Dr. Tinnie Gens on 07-26-2015 after having 2 recent episodes of spontaneous bleeds form left ankle.  Mr. Spencer Todd has continued wearing compression hose since that visit and states yesterday (09-28-2015) he had another spontaneous bleed from varicosity left ankle which he stopped.  Reviewed with Mr. Spencer Todd how to stop bleeding if it reoccurred.  Dr. Kellie Todd recommended endovenous laser ablation of left greater saphenous vein at 07-26-2015 office visit.  Discussed Dr. Evelena Todd recommendation with Spencer Todd and he states he will call [facility] to schedule.

## 2015-12-07 ENCOUNTER — Encounter: Payer: Self-pay | Admitting: Vascular Surgery

## 2015-12-15 ENCOUNTER — Ambulatory Visit (HOSPITAL_COMMUNITY)
Admission: RE | Admit: 2015-12-15 | Discharge: 2015-12-15 | Disposition: A | Discharge status: Home / Self Care | Payer: Medicare Other | Source: Ambulatory Visit | Attending: Vascular Surgery | Admitting: Vascular Surgery

## 2015-12-15 ENCOUNTER — Ambulatory Visit (INDEPENDENT_AMBULATORY_CARE_PROVIDER_SITE_OTHER): Payer: Medicare Other | Admitting: Vascular Surgery

## 2015-12-15 ENCOUNTER — Encounter: Payer: Self-pay | Admitting: Vascular Surgery

## 2015-12-15 VITALS — BP 154/76 | HR 61 | Temp 97.1°F | Resp 18 | Ht 71.0 in | Wt 184.6 lb

## 2015-12-15 DIAGNOSIS — I868 Varicose veins of other specified sites: Secondary | ICD-10-CM

## 2015-12-15 DIAGNOSIS — I739 Peripheral vascular disease, unspecified: Secondary | ICD-10-CM

## 2015-12-15 DIAGNOSIS — I83812 Varicose veins of left lower extremities with pain: Secondary | ICD-10-CM | POA: Diagnosis not present

## 2015-12-15 NOTE — Progress Notes (Signed)
HISTORY AND PHYSICAL        CC:  PAD Referring Provider:  Earney Mallet, MD   HPI: This is a 82 y.o. male who presents today for his 6 month check up for PAD.  He states that he is actually walking farther than the last time he was in 6 months ago.  His left leg is worse.  He denies any non healing wounds to either of his feet.  He was referred to a podiatrist at the last visit.  He states that his bunions on his feet bother him usually after wearing his dress shoes all day for church.  Otherwise, he has no complaints.  He states that he had a bleeding varicose vein in the recent past and stopped after pressure was applied.    Over the past 6 months he's had 6 episodes of significant bleeding from a varix in the left lower leg. He has chronic skin changes in the left leg and chronic swelling and has aching throbbing and burning discomfort in varicosities particularly below the knee on the left. He has no symptoms in the right leg. He has no history of stasis ulcers. He was evaluated by [facility] 2 years ago and started wearing elastic compression stockings at that time which she has continued.    He has never smoked.  He eats a healthy Mediterranean diet.   He does have an inhaler for his asthma.  He does not take any prescription medications, but does take vitamin supplements.         Past Medical History   Diagnosis  Date   .  Varicose veins     .  Atherosclerotic vascular disease     .  Venous insufficiency         No past surgical history on file.        Allergies   Allergen  Reactions   .  Penicillins  Hypertension   .  Tetracyclines & Related  Hives   .  Vancomycin         Upset stomach        Current Outpatient Prescriptions on File Prior to Visit  Medication Sig Dispense Refill  . Acetylcarnitine HCl (ACETYL-L-CARNITINE HCL) POWD by Does not apply route.    . ALBUTEROL IN Inhale into the lungs.    . Ascorbic Acid (VITAMIN C PO) Take by mouth.    .  Carnitine-B5-B6 500-15-5 MG TABS Take 2 tablets by mouth daily.    . Cholecalciferol (VITAMIN D-3 PO) Take by mouth.    . Coenzyme Q10 (EQL COQ10) 300 MG CAPS Take by mouth daily.    . Fish Oil-Cholecalciferol (FISH OIL + D3 PO) Take by mouth.    Marland Kitchen GINKGO BILOBA PO Take by mouth.    . GLYCINE PO Take by mouth daily.    Marland Kitchen MAGNESIUM PO Take by mouth.    . Menaquinone-7 (VITAMIN K2 PO) Take by mouth.    . Nattokinase 100 MG CAPS Take by mouth daily.    Marland Kitchen VITAMIN E PO Take by mouth.     No current facility-administered medications on file prior to visit.            Family History   Problem  Relation  Age of Onset   .  Hypertension  Mother     .  Heart disease  Mother     .  Heart disease  Father         before age 35   .  Heart attack  Father     .  Heart disease  Brother     .  Hyperlipidemia  Brother     .  Heart attack  Brother         History          Social History   .  Marital Status:  Married       Spouse Name:  N/A   .  Number of Children:  N/A   .  Years of Education:  N/A        Occupational History   .  Not on file.         Social History Main Topics   .  Smoking status:  Never Smoker    .  Smokeless tobacco:  Never Used   .  Alcohol Use:  No   .  Drug Use:  No   .  Sexual Activity:  Not on file         Other Topics  Concern   .  Not on file     Social History Narrative       Review of systems: He denies shortness of breath. He denies chest pain.   PHYSICAL EXAMINATION:  Vitals:   12/15/15 1328  BP: (!) 154/76  Pulse: 61  Resp: 18  Temp: 97.1 F (36.2 C)  TempSrc: Oral  SpO2: 95%  Weight: 184 lb 9.6 oz (83.7 kg)  Height: 5\' 11"  (1.803 m)     General:  WDWN in NAD Pulmonary: normal non-labored breathing , without Rales, rhonchi,  wheezing Cardiac: RRR, without  Murmurs, rubs or gallops; without carotid bruits Abdomen: soft, NT, no masses Skin: without rashes, 2 areas of 1 cm eschar over varicosities left gaiter area Vascular  Exam/Pulses: Absent popliteal and pedal pulses bilaterally Extremities: without ischemic changes, without Gangrene , without cellulitis; without open wounds; varicose veins bilaterally with left > right Musculoskeletal: no muscle wasting or atrophy       Neurologic: A&O X 3; Appropriate Affect ; SENSATION: normal; MOTOR FUNCTION:  moving all extremities equally. Speech is fluent/normal     Non-Invasive Vascular Imaging:   Today's ABI's: Right:  0.6 Left:  0.54    ASSESSMENT/PLAN:: 82 y.o. male with PAD and intermittent claudication that is not lifestyle limiting.  Symptomatic varicose veins with bleeding.   Patient has stable peripheral arterial disease and is overall fairly active. If he needed any intervention on his foot or developed a wound on his left foot he would most likely need revascularization but currently he is not planning on any events for his foot. Otherwise he has stable peripheral arterial disease and is not currently at risk of limb loss. He will follow-up with me in 1 year with repeat ABIs at that time  As far as a bleeding varicose veins are concerned. He was previously offered laser ablation by Dr. Kellie Simmering in the past but decided he did not want the procedure. He is now willing to consider this. I discussed with him as well as Dr. Kellie Simmering that he is at risk for nonhealing wounds and continued bleeding without laser ablation.  We will attempt to get this scheduled with Dr. Kellie Simmering as soon as possible.   Ruta Hinds, MD [facility] Office: [phone] Pager: [phone]

## 2015-12-19 ENCOUNTER — Other Ambulatory Visit: Payer: Self-pay | Admitting: *Deleted

## 2015-12-19 DIAGNOSIS — I83812 Varicose veins of left lower extremities with pain: Secondary | ICD-10-CM

## 2015-12-22 ENCOUNTER — Ambulatory Visit: Payer: Medicare Other | Admitting: Podiatry

## 2016-01-19 ENCOUNTER — Ambulatory Visit: Payer: Medicare Other | Admitting: Podiatry

## 2016-01-24 ENCOUNTER — Ambulatory Visit (INDEPENDENT_AMBULATORY_CARE_PROVIDER_SITE_OTHER): Payer: Medicare Other

## 2016-01-24 ENCOUNTER — Ambulatory Visit (INDEPENDENT_AMBULATORY_CARE_PROVIDER_SITE_OTHER): Payer: Medicare Other | Admitting: Podiatry

## 2016-01-24 ENCOUNTER — Encounter: Payer: Self-pay | Admitting: Podiatry

## 2016-01-24 DIAGNOSIS — B351 Tinea unguium: Secondary | ICD-10-CM

## 2016-01-24 DIAGNOSIS — M79676 Pain in unspecified toe(s): Secondary | ICD-10-CM

## 2016-01-24 DIAGNOSIS — L600 Ingrowing nail: Secondary | ICD-10-CM

## 2016-01-24 DIAGNOSIS — M722 Plantar fascial fibromatosis: Secondary | ICD-10-CM

## 2016-01-24 DIAGNOSIS — R200 Anesthesia of skin: Secondary | ICD-10-CM

## 2016-01-24 DIAGNOSIS — R202 Paresthesia of skin: Secondary | ICD-10-CM | POA: Diagnosis not present

## 2016-01-24 NOTE — Progress Notes (Signed)
He presents today with chief complaint of right heel pain and have his nails trimmed. He states that he currently has vascular problems in both extremities.  Objective: Vital signs are stable alert and oriented 3 and have reviewed his ABIs which demonstrate peripheral vascular disease bilaterally. It also demonstrates nonhealing ability. He has sharp related to nails that are thick yellow dystrophic and clinically mycotic he also has pain on palpation medial calcaneal tubercle of the right heel radiographs taken today do not demonstrate any fracture but soft tissue increase in density of plantar fascia insertion site.  Assessment: Pain in limb secondary peripheral vascular disease pain in limb secondary to onychomycosis and pain in limb secondary to plantar fasciitis.  Plan: I injected the right heel today with Kenalog and local anesthetic placement of plantar fascia brace and a night splint. Debrided his toenails bilaterally.

## 2016-01-24 NOTE — Patient Instructions (Addendum)

## 2016-02-06 ENCOUNTER — Other Ambulatory Visit: Payer: Medicare Other | Admitting: Vascular Surgery

## 2016-02-14 ENCOUNTER — Ambulatory Visit: Payer: Medicare Other | Admitting: Vascular Surgery

## 2016-02-14 ENCOUNTER — Encounter (HOSPITAL_COMMUNITY): Payer: Medicare Other

## 2016-02-21 ENCOUNTER — Ambulatory Visit: Payer: Medicare Other | Admitting: Podiatry

## 2016-03-14 ENCOUNTER — Telehealth: Payer: Self-pay | Admitting: *Deleted

## 2016-03-14 NOTE — Telephone Encounter (Signed)
Male caller states pt needs something for pain. I spoke with pt and he said he guessed his wife called because his toenails hurt sometimes, but his heel is fine since he saw Dr. Milinda Pointer. I asked pt if he felt he needed to come in and he said he guessed Dr. Milinda Pointer could trim them out like before, he's been using epsom salt soaks that help. I told him if he could put a little neosporin along the corners of the nail that would keep the skin soft and not hard or cracking. Pt states he uses vaseline, I told him not to put between his toes, but it would help. Transferred pt to Schedulers.

## 2016-04-12 ENCOUNTER — Encounter: Payer: Self-pay | Admitting: Podiatry

## 2016-04-12 ENCOUNTER — Ambulatory Visit (INDEPENDENT_AMBULATORY_CARE_PROVIDER_SITE_OTHER): Payer: Medicare Other | Admitting: Podiatry

## 2016-04-12 DIAGNOSIS — M79676 Pain in unspecified toe(s): Secondary | ICD-10-CM

## 2016-04-12 DIAGNOSIS — B351 Tinea unguium: Secondary | ICD-10-CM | POA: Diagnosis not present

## 2016-04-12 NOTE — Progress Notes (Signed)
Mr. Spencer Todd presents today with chief complaint of painful elongated toenails.  Objective: Toenails are long thick yellow dystrophic mycotic mycotic pulses remain palpable.  Assessment: Pain and limp secondary to onychomycosis.  Plan: Debridement of toenails 1 through 5 bilateral.

## 2016-06-01 ENCOUNTER — Encounter: Payer: Self-pay | Admitting: Vascular Surgery

## 2016-06-14 ENCOUNTER — Ambulatory Visit (HOSPITAL_COMMUNITY)
Admission: RE | Admit: 2016-06-14 | Discharge: 2016-06-14 | Disposition: A | Discharge status: Home / Self Care | Payer: Medicare Other | Source: Ambulatory Visit | Attending: Vascular Surgery | Admitting: Vascular Surgery

## 2016-06-14 ENCOUNTER — Ambulatory Visit: Payer: Medicare Other | Admitting: Vascular Surgery

## 2016-06-14 ENCOUNTER — Ambulatory Visit (INDEPENDENT_AMBULATORY_CARE_PROVIDER_SITE_OTHER): Payer: Medicare Other | Admitting: Vascular Surgery

## 2016-06-14 ENCOUNTER — Encounter: Payer: Self-pay | Admitting: Vascular Surgery

## 2016-06-14 VITALS — BP 151/73 | HR 63 | Temp 97.4°F | Resp 20 | Ht 71.0 in | Wt 183.1 lb

## 2016-06-14 DIAGNOSIS — I83813 Varicose veins of bilateral lower extremities with pain: Secondary | ICD-10-CM

## 2016-06-14 DIAGNOSIS — I739 Peripheral vascular disease, unspecified: Secondary | ICD-10-CM | POA: Diagnosis not present

## 2016-06-14 NOTE — Progress Notes (Signed)
Patient is an 83 year old male who presents today for follow-up of peripheral arterial disease. He denies any claudication symptoms. He occasionally has pain in his right first toe. He also has had intermittent bleeding episodes from varicose veins in his left leg but has not wish to have an intervention on this. He currently is treating this with a nonadherent dressing to protect it. He had one bleeding episode about a month ago. He denies any nonhealing wounds on his feet. He denies rest pain. He currently walks as much as he wishes.  Review of systems: He sometimes has wheezing from his asthma. He also has shortness of breath when laying completely flat.  Physical exam:  Vitals:   06/14/16 1347 06/14/16 1350  BP: (!) 161/77 (!) 151/73  Pulse: 63   Resp: 20   Temp: 97.4 F (36.3 C)   TempSrc: Oral   SpO2: 93%   Weight: 183 lb 1.6 oz (83.1 kg)   Height: 5\' 11"  (1.803 m)    Extremities: 2+ right femoral 1+ left femoral pulse absent popliteal and pedal pulses  Skin: Diffuse varicosities with 2 areas of dry eschar 1 cm in diameter left leg  Chest: Clear to auscultation bilaterally  Cardiac: Regular rate and rhythm  Neck: No carotid bruits  Data: Patient had bilateral ABIs performed today which were 0.38 on the left 0.76 on the right monophasic bilaterally   ASSESSMENT/PLAN:: 84 y.o.malewith PAD and intermittent claudication that is not lifestyle limiting.  Symptomatic varicose veins with bleeding.  Patient has stable peripheral arterial disease and is overall fairly active. If he needed any intervention on his foot or developed a wound on his left foot he would most likely need revascularization but currently he is not planning on any events for his foot. Otherwise he has stable peripheral arterial disease and is not currently at risk of limb loss. He will follow-up with me in 1 year with repeat ABIs at that time  As far as a bleeding varicose veins are concerned. He was  previously offered laser ablation by Dr. Kellie Simmering in the past but decided he did not want the procedure. I encouraged him to continue using the Telfa to protect the veins. He will place an Ace wrap over this for some compression.  Spencer Hinds, MD [facility] Office: [phone] Pager: [phone]

## 2016-06-22 NOTE — Addendum Note (Signed)
Addended by: Lianne Cure A on: 06/22/2016 02:37 PM   Modules accepted: Orders

## 2016-07-05 ENCOUNTER — Encounter: Payer: Self-pay | Admitting: Podiatry

## 2016-07-05 ENCOUNTER — Ambulatory Visit (INDEPENDENT_AMBULATORY_CARE_PROVIDER_SITE_OTHER): Payer: Medicare Other | Admitting: Podiatry

## 2016-07-05 ENCOUNTER — Ambulatory Visit: Payer: Medicare Other | Admitting: Podiatry

## 2016-07-05 DIAGNOSIS — M79676 Pain in unspecified toe(s): Secondary | ICD-10-CM

## 2016-07-05 DIAGNOSIS — B351 Tinea unguium: Secondary | ICD-10-CM | POA: Diagnosis not present

## 2016-07-05 DIAGNOSIS — G5791 Unspecified mononeuropathy of right lower limb: Secondary | ICD-10-CM

## 2016-07-05 NOTE — Progress Notes (Signed)
He presents today chief complaint of pain to the dorsal aspect of the right foot. He states that it feels like an electrical pain running down to my big toe. He is also complaining of painful elongated toenails.   objective: Vital signs are stable alert and oriented 3 has pain on palpation of the peroneal nerve of the right foot with thick yellow dystrophic onychomycotic nails.  Assessment: Neuritis deep peroneal nerve right. Pain in limb secondary to onychomycosis.  Plan: I injected with dexamethasone and local anesthetic today for maximal tenderness. Also debrided toenails 1 through 5 bilateral.

## 2017-01-01 ENCOUNTER — Ambulatory Visit: Payer: Medicare Other | Admitting: Podiatry

## 2017-01-17 ENCOUNTER — Ambulatory Visit (INDEPENDENT_AMBULATORY_CARE_PROVIDER_SITE_OTHER): Payer: Medicare Other | Admitting: Podiatry

## 2017-01-17 ENCOUNTER — Encounter: Payer: Self-pay | Admitting: Podiatry

## 2017-01-17 DIAGNOSIS — B351 Tinea unguium: Secondary | ICD-10-CM

## 2017-01-17 DIAGNOSIS — G5791 Unspecified mononeuropathy of right lower limb: Secondary | ICD-10-CM

## 2017-01-17 DIAGNOSIS — M79676 Pain in unspecified toe(s): Secondary | ICD-10-CM | POA: Diagnosis not present

## 2017-01-19 NOTE — Progress Notes (Signed)
He presents today chief complaint of painful elongated toenails.  States that the neuritis seems to be doing a little better he is not interested in injection today at this point.  Objective: Pulses are palpable.  Still has some tenderness on palpation across the dorsal aspect of the right foot.  He states that this feels much better than it did at one time.  His toenails are long thick yellow dystrophic-like mycotic and painful.  Assessment: Resolving neuritis dorsal aspect of the right foot pain in limb secondary to onychomycosis bilaterally.  Plan: Debridement of toenails 1 through 5 bilateral.  Follow-up with him in 6 months.

## 2017-02-04 ENCOUNTER — Encounter (HOSPITAL_COMMUNITY): Payer: Medicare Other

## 2017-03-13 ENCOUNTER — Telehealth: Payer: Self-pay | Admitting: *Deleted

## 2017-03-13 NOTE — Telephone Encounter (Signed)
Mr. Spencer Todd called with what sounds like venous swelling in his LLE, the swelling is better with elevation. He has little to no pain in the leg and has no change in temperature or coloration. He is afebrile. Patient reports no falls or injury to the left leg. He wishes to move up his June appts so I will have our staff contact him.

## 2017-03-14 NOTE — Telephone Encounter (Signed)
Pt has an appt at [facility] in [place] on 04/18/17, offered pt to sch ABI and MD on that day since pt lived in [place], pt did not want too much in the same afternoon and wanted to resch a carotid study he had cxl'd here in the past, requested late April/early May for the appt here, sched appts with pt for 05/09/17; labs at 2:00 and MD at 3:15

## 2017-04-16 ENCOUNTER — Telehealth: Payer: Self-pay | Admitting: *Deleted

## 2017-04-16 NOTE — Telephone Encounter (Signed)
Patient called to report that he has been diagnosed with prostate CA and has an appt with Dr. Gloriann Loan at [facility]. His next appt with Dr. Oneida Alar is still scheduled for 05-09-17 for his leg edema and PAD followup.

## 2017-04-18 ENCOUNTER — Ambulatory Visit: Payer: Medicare Other | Admitting: Podiatry

## 2017-04-18 ENCOUNTER — Ambulatory Visit: Payer: Medicare Other | Admitting: Vascular Surgery

## 2017-04-18 ENCOUNTER — Encounter (HOSPITAL_COMMUNITY): Payer: Medicare Other

## 2017-05-09 ENCOUNTER — Encounter: Payer: Self-pay | Admitting: Vascular Surgery

## 2017-05-09 ENCOUNTER — Other Ambulatory Visit (HOSPITAL_COMMUNITY): Payer: Self-pay | Admitting: Family Medicine

## 2017-05-09 ENCOUNTER — Ambulatory Visit (INDEPENDENT_AMBULATORY_CARE_PROVIDER_SITE_OTHER): Payer: Medicare Other | Admitting: Vascular Surgery

## 2017-05-09 ENCOUNTER — Ambulatory Visit (HOSPITAL_COMMUNITY)
Admission: RE | Admit: 2017-05-09 | Discharge: 2017-05-09 | Disposition: A | Discharge status: Home / Self Care | Payer: Medicare Other | Source: Ambulatory Visit | Attending: Vascular Surgery | Admitting: Vascular Surgery

## 2017-05-09 ENCOUNTER — Other Ambulatory Visit: Payer: Self-pay

## 2017-05-09 VITALS — BP 172/71 | HR 62 | Temp 97.0°F | Resp 18 | Ht 71.0 in | Wt 178.0 lb

## 2017-05-09 DIAGNOSIS — I6523 Occlusion and stenosis of bilateral carotid arteries: Secondary | ICD-10-CM | POA: Insufficient documentation

## 2017-05-09 DIAGNOSIS — I6522 Occlusion and stenosis of left carotid artery: Secondary | ICD-10-CM

## 2017-05-09 DIAGNOSIS — I739 Peripheral vascular disease, unspecified: Secondary | ICD-10-CM

## 2017-05-09 NOTE — Progress Notes (Signed)
Patient is an 84 year old male who presents today for follow-up for peripheral arterial disease.  He currently denies any claudication symptoms.  He has had some trouble with bleeding from varicose veins in the past but not recently.  We discussed a laser ablation in the past but he deferred that.  He denies rest pain or nonhealing wounds on his feet.  He has been ill recently and was recently diagnosed with stage IV prostate cancer.  He is beginning an initiation of hormone therapy for this.  He has an indwelling Foley catheter.  He has had significant weight loss.  He has also developed fairly significant bilateral lower extremity edema.  This has improved a little bit.  Current Outpatient Medications on File Prior to Visit  Medication Sig Dispense Refill  . Acetylcarnitine HCl (ACETYL-L-CARNITINE HCL) POWD by Does not apply route.    . ALBUTEROL IN Inhale into the lungs.    . Ascorbic Acid (VITAMIN C PO) Take by mouth.    . Calcium Carbonate-Vit D-Min (CALCIUM 1200 PO) Take 1,200 mg by mouth daily.    . Carnitine-B5-B6 500-15-5 MG TABS Take 2 tablets by mouth daily.    . Cholecalciferol (VITAMIN D-3 PO) Take by mouth.    . Coenzyme Q10 (EQL COQ10) 300 MG CAPS Take by mouth daily.    . Fish Oil-Cholecalciferol (FISH OIL + D3 PO) Take by mouth.    Marland Kitchen GINKGO BILOBA PO Take by mouth.    . GLYCINE PO Take by mouth daily.    Marland Kitchen MAGNESIUM PO Take by mouth.    . Menaquinone-7 (VITAMIN K2 PO) Take by mouth.    . montelukast (SINGULAIR) 10 MG tablet TK 1 T PO 1 TIME IN THE EVE  12  . Nattokinase 100 MG CAPS Take by mouth daily.    Marland Kitchen PROAIR HFA 108 (90 Base) MCG/ACT inhaler INL 2 PFS PO QID PRN  5  . VITAMIN E PO Take by mouth.    . levofloxacin (LEVAQUIN) 750 MG tablet TK 1 T PO THE MORNING OF PROCEDURE  0  . losartan (COZAAR) 50 MG tablet TK 1 T PO D  3   No current facility-administered medications on file prior to visit.      Past Medical History:  Diagnosis Date  . Atherosclerotic vascular  disease   . Varicose veins   . Venous insufficiency    Physical exam:  Vitals:   05/09/17 1457 05/09/17 1506 05/09/17 1509  BP: (!) 166/67 (!) 170/69 (!) 172/71  Pulse: 62 62 62  Resp: 18    Temp: (!) 97 F (36.1 C)    TempSrc: Oral    SpO2: 99%    Weight: 178 lb (80.7 kg)    Height: 5\' 11"  (1.803 m)      Neck: No carotid bruits  HEENT bilateral temporal wasting  Chest: Clear to auscultation bilaterally  Cardiac: Regular rate and rhythm without murmur  Abdomen: Soft nontender nondistended no obvious mass  Extremities: 2+ femoral pulses bilaterally absent popliteal and pedal pulses 2+ edema extending from the hips all the way down to the feet bilaterally.  No open wounds no ulcers no cellulitis no skin compromise  Data: Patient had a lower extremity ABI today 0.7 on the right 0.4 on the left this is essentially unchanged from 1 year ago.  He also had a carotid duplex exam today which showed 60 to 80% right internal carotid artery stenosis less than 40% left  Assessment: Lower extremity edema most likely secondary  to pack protein calorie malnutrition with the weight loss associated with his cancer.  Encouraged patient to eat as much protein as possible to rebuild his protein stores.  He also has a component of venous reflux which is probably contributing to the swelling.  I do not believe he needs an intervention currently with his ongoing cancer.  However, he would benefit from continued wearing of compression stockings and leg elevation.  Peripheral arterial disease his carotids he has a 60% 80% stenosis of the right internal carotid artery which is currently asymptomatic.  We will continue to observe this unless it becomes more than 80% or symptomatic.  He will have a carotid duplex scan in 6 months time.  Lower extremity arterial disease ABIs have been stable over the last year.  He does not exhibit claudication symptoms rest pain or any tissue loss.  We will continue to do  surveillance ABIs on him unless he develops a nonhealing wound.  He will return for follow-up sooner if he has a nonhealing wound on his foot.  Otherwise we will see him back with repeat bilateral ABIs in 6 months time.  Plan: See above  Ruta Hinds, MD [facility] Office: [phone] Pager: [phone]

## 2017-05-09 NOTE — Progress Notes (Signed)
Vitals:   05/09/17 1457 05/09/17 1506  BP:  (!) 170/69  Pulse:  62  Resp: 18   SpO2: 99%   Weight: 178 lb (80.7 kg)   Height: 5\' 11"  (1.803 m)

## 2017-05-28 ENCOUNTER — Telehealth: Payer: Self-pay | Admitting: *Deleted

## 2017-05-28 NOTE — Telephone Encounter (Signed)
Patient called to report that he has an area of blistering and drainage on his LLE x 2 days. He does has some edema and has been increasing protein in his diet as instructed. He states that he has no discoloration in the lower leg and only slight pain. No injury or fall noted. Patient is afebrile. At his last appt with Dr. Oneida Alar he was instructed to see [place] if he had any skin breakdown because his ABIs were diminished at 0.4 on the left ( April 2019). Mr Tribby will come in on Thursday to see our PA for evaluation. He voiced agreement and understanding of this.

## 2017-05-30 ENCOUNTER — Ambulatory Visit (INDEPENDENT_AMBULATORY_CARE_PROVIDER_SITE_OTHER): Payer: Medicare Other | Admitting: Physician Assistant

## 2017-05-30 ENCOUNTER — Other Ambulatory Visit: Payer: Self-pay

## 2017-05-30 ENCOUNTER — Encounter: Payer: Self-pay | Admitting: *Deleted

## 2017-05-30 ENCOUNTER — Other Ambulatory Visit: Payer: Self-pay | Admitting: *Deleted

## 2017-05-30 VITALS — BP 176/64 | HR 66 | Temp 97.0°F | Resp 16 | Ht 71.0 in | Wt 183.0 lb

## 2017-05-30 DIAGNOSIS — I739 Peripheral vascular disease, unspecified: Secondary | ICD-10-CM

## 2017-05-30 DIAGNOSIS — I6523 Occlusion and stenosis of bilateral carotid arteries: Secondary | ICD-10-CM | POA: Diagnosis not present

## 2017-05-30 NOTE — Progress Notes (Addendum)
HISTORY AND PHYSICAL     CC:  New blister/sore on left lower leg Requesting Provider:  Josem Kaufmann, MD  HPI: This is a 84 y.o. male who was seen by Dr. Oneida Alar about 3 weeks ago for follow up for PAD.  At that time, he did not have any claudication sx but did have some trouble with bleeding from varicose veins in the past but none recently.  Laser ablation had been discussed with pt by Dr. Oneida Alar in the past, but the pt declined.  At the visit 3 weeks ago, the pt denied and rest pain or non healing wounds.  He had some edema in BLE at that time that had a little improvement.   He has recently been diagnosed with stage IV prostate cancer and has initiated hormonal therapy for this.  He has had weight loss.    Dr. Oneida Alar felt that his BLE edema was most likely secondary to protein calorie malnutrition with weight loss associated with his cancer diagnosis.  Dr. Oneida Alar encouraged him to take in as much protein as possible.  Pt also has a venous reflux component that is most likely also contributing to his edema.  Dr. Oneida Alar encouraged him to wear compression stockings and felt he would benefit from this.    Also at that visit, his ABI's were stable and he did not have any claudication sx.  Surveillance was to be continued in 6 months unless he developed a wound and then would return.  He returns today with a new blister on the left leg.  He states that he has had swelling in his legs and has started wearing compression when he can get them on.  He developed a blister about 3 days ago that started draining clear liquid today.  He was told by Dr. Oneida Alar that if he developed a wound to return to clinic.  He also has smaller blisters on his both his lower legs.    He does have a 60-80% stenosis of the right ICA, which is asymptomatic.  He will also have a carotid duplex in 6 months.  Continued observation unless he becomes asymptomatic or becomes > 80% stenosis.    He is on Nattokinase that is a type of  anticoagulant.  He is an Scientist, research (life sciences).  Past Medical History:  Diagnosis Date  . Atherosclerotic vascular disease   . Varicose veins   . Venous insufficiency     Past Surgical History:  Procedure Laterality Date  . APPENDECTOMY  1976  . HERNIA REPAIR      Allergies  Allergen Reactions  . Penicillins Hypertension  . Tetracyclines & Related Hives  . Vancomycin     Upset stomach    Current Outpatient Medications  Medication Sig Dispense Refill  . Acetylcarnitine HCl (ACETYL-L-CARNITINE HCL) POWD by Does not apply route.    . ALBUTEROL IN Inhale into the lungs.    . Ascorbic Acid (VITAMIN C PO) Take by mouth.    . Calcium Carbonate-Vit D-Min (CALCIUM 1200 PO) Take 1,200 mg by mouth daily.    . Carnitine-B5-B6 500-15-5 MG TABS Take 2 tablets by mouth daily.    . Cholecalciferol (VITAMIN D-3 PO) Take by mouth.    . Coenzyme Q10 (EQL COQ10) 300 MG CAPS Take by mouth daily.    . Fish Oil-Cholecalciferol (FISH OIL + D3 PO) Take by mouth.    Marland Kitchen GINKGO BILOBA PO Take by mouth.    . GLYCINE PO Take by mouth daily.    Marland Kitchen  levofloxacin (LEVAQUIN) 750 MG tablet TK 1 T PO THE MORNING OF PROCEDURE  0  . losartan (COZAAR) 50 MG tablet TK 1 T PO D  3  . MAGNESIUM PO Take by mouth.    . Menaquinone-7 (VITAMIN K2 PO) Take by mouth.    . montelukast (SINGULAIR) 10 MG tablet TK 1 T PO 1 TIME IN THE EVE  12  . Nattokinase 100 MG CAPS Take by mouth daily.    Marland Kitchen PROAIR HFA 108 (90 Base) MCG/ACT inhaler INL 2 PFS PO QID PRN  5  . VITAMIN E PO Take by mouth.     No current facility-administered medications for this visit.     Family History  Problem Relation Age of Onset  . Hypertension Mother   . Heart disease Mother   . Heart disease Father        before age 27  . Heart attack Father   . Heart disease Brother   . Hyperlipidemia Brother   . Heart attack Brother     Social History   Socioeconomic History  . Marital status: Married    Spouse name: Not on file  . Number of children:  Not on file  . Years of education: Not on file  . Highest education level: Not on file  Occupational History  . Not on file  Social Needs  . Financial resource strain: Not on file  . Food insecurity:    Worry: Not on file    Inability: Not on file  . Transportation needs:    Medical: Not on file    Non-medical: Not on file  Tobacco Use  . Smoking status: Never Smoker  . Smokeless tobacco: Never Used  Substance and Sexual Activity  . Alcohol use: No    Alcohol/week: 0.0 oz  . Drug use: No  . Sexual activity: Not on file  Lifestyle  . Physical activity:    Days per week: Not on file    Minutes per session: Not on file  . Stress: Not on file  Relationships  . Social connections:    Talks on phone: Not on file    Gets together: Not on file    Attends religious service: Not on file    Active member of club or organization: Not on file    Attends meetings of clubs or organizations: Not on file    Relationship status: Not on file  . Intimate partner violence:    Fear of current or ex partner: Not on file    Emotionally abused: Not on file    Physically abused: Not on file    Forced sexual activity: Not on file  Other Topics Concern  . Not on file  Social History Narrative  . Not on file     REVIEW OF SYSTEMS:   [X]  denotes positive finding, [ ]  denotes negative finding Cardiac  Comments:  Chest pain or chest pressure:    Shortness of breath upon exertion:    Short of breath when lying flat:    Irregular heart rhythm:        Vascular    Pain in calf, thigh, or hip brought on by ambulation:    Pain in feet at night that wakes you up from your sleep:     Blood clot in your veins:    Leg swelling:         Pulmonary    Oxygen at home:    Productive cough:     Wheezing:  Neurologic    Sudden weakness in arms or legs:     Sudden numbness in arms or legs:     Sudden onset of difficulty speaking or slurred speech:    Temporary loss of vision in one eye:      Problems with dizziness:         Gastrointestinal    Blood in stool:     Vomited blood:         Genitourinary    Burning when urinating:     Blood in urine:        Psychiatric    Major depression:         Hematologic    Bleeding problems:    Problems with blood clotting too easily:        Skin    Rashes or ulcers: x See HPI      Constitutional    Fever or chills:      PHYSICAL EXAMINATION:  Vitals:   05/30/17 1345  BP: (!) 176/64  Pulse: 66  Resp: 16  Temp: (!) 97 F (36.1 C)  SpO2: 100%   Vitals:   05/30/17 1345  Weight: 183 lb (83 kg)  Height: 5\' 11"  (1.803 m)    General:  WDWN in NAD; vital signs documented above Gait: Not observed HENT: WNL, normocephalic Pulmonary: normal non-labored breathing , without Rales, rhonchi, he does have mild exp wheezing bilaterally Cardiac: regular HR, with Murmur with left carotid bruit Abdomen: soft, NT, no masses Skin: without rashes Vascular Exam/Pulses:  Right Left  Radial 2+ (normal) 2+ (normal)  Ulnar 1+ (weak) 1+ (weak)  Femoral 2+ (normal) 2+ (normal)   Extremities: BLE swelling; 3x3cm blister on the LLE as well as smaller blisters      Musculoskeletal: no muscle wasting or atrophy  Neurologic: A&O X 3;  No focal weakness or paresthesias are detected Psychiatric:  The pt has Normal affect.   Non-Invasive Vascular Imaging:   ABI's 05/09/17: Right:  0.71 Left:  0.41 TBI's 05/09/17: Right:  0.43 Left: 0.22  Carotid duplex 05/09/17: Final Interpretation: Right Carotid: Velocities in the right ICA are consistent with a 60-79%        stenosis. The ECA appears >50% stenosed.  Left Carotid: Velocities in the left ICA are consistent with a 1-39% stenosis.  Vertebrals: Bilateral vertebral arteries demonstrate antegrade flow. Subclavians: Normal flow hemodynamics were seen in bilateral subclavian       arteries.  Pt meds includes: Statin:  No. Beta Blocker:  No. Aspirin:  No. ACEI:   No. ARB:  Yes.   CCB use:  Yes Other Antiplatelet/Anticoagulant:  Nattokinase 100mg    ASSESSMENT/PLAN:: 84 y.o. male with carotid stenosis, PAD with recently diagnosed stage IV prostate cancer now with new non healing wound on his left lower leg   -pt seen with Dr. Oneida Alar.  An 11 blade was used to knick the blister and drain, which was clear fluid.  Dr. Oneida Alar is concerned about this wound healing given his ABI on the left is 0.41.  He has recommended arteriogram with Dr. Donzetta Matters next Wednesday. The pt takes Nattokinase and has been instructed not to take this until after his procedure is over.  He does have an indwelling foley catheter.  There are no labs in our system so he will need labs when he comes for his procedure.  He is having labs drawn in [place] on Monday-I have instructed his wife to bring a copy of the results. -The pt is put  in an [place] boot today and will come back every week to get this changed by the nurse and will see Dr. Oneida Alar in 4 weeks.   -he is doing well with his protein intake   Leontine Locket, PA-C [facility] [phone]  Clinic MD:  Pt seen and examined with Dr. Oneida Alar

## 2017-06-05 ENCOUNTER — Encounter (HOSPITAL_COMMUNITY)
Admission: RE | Disposition: A | Discharge status: Home / Self Care | Payer: Self-pay | Source: Ambulatory Visit | Attending: Vascular Surgery

## 2017-06-05 ENCOUNTER — Ambulatory Visit (HOSPITAL_COMMUNITY)
Admission: RE | Admit: 2017-06-05 | Discharge: 2017-06-05 | Disposition: A | Discharge status: Home / Self Care | Payer: Medicare Other | Source: Ambulatory Visit | Attending: Vascular Surgery | Admitting: Vascular Surgery

## 2017-06-05 DIAGNOSIS — Z881 Allergy status to other antibiotic agents status: Secondary | ICD-10-CM | POA: Diagnosis not present

## 2017-06-05 DIAGNOSIS — S80822A Blister (nonthermal), left lower leg, initial encounter: Secondary | ICD-10-CM | POA: Insufficient documentation

## 2017-06-05 DIAGNOSIS — I2510 Atherosclerotic heart disease of native coronary artery without angina pectoris: Secondary | ICD-10-CM | POA: Diagnosis not present

## 2017-06-05 DIAGNOSIS — I6521 Occlusion and stenosis of right carotid artery: Secondary | ICD-10-CM | POA: Diagnosis not present

## 2017-06-05 DIAGNOSIS — Z7901 Long term (current) use of anticoagulants: Secondary | ICD-10-CM | POA: Insufficient documentation

## 2017-06-05 DIAGNOSIS — Z880 Allergy status to penicillin: Secondary | ICD-10-CM | POA: Insufficient documentation

## 2017-06-05 DIAGNOSIS — Z79899 Other long term (current) drug therapy: Secondary | ICD-10-CM | POA: Insufficient documentation

## 2017-06-05 DIAGNOSIS — I998 Other disorder of circulatory system: Secondary | ICD-10-CM | POA: Insufficient documentation

## 2017-06-05 DIAGNOSIS — X58XXXA Exposure to other specified factors, initial encounter: Secondary | ICD-10-CM | POA: Diagnosis not present

## 2017-06-05 DIAGNOSIS — C61 Malignant neoplasm of prostate: Secondary | ICD-10-CM | POA: Diagnosis not present

## 2017-06-05 DIAGNOSIS — Z9889 Other specified postprocedural states: Secondary | ICD-10-CM | POA: Insufficient documentation

## 2017-06-05 DIAGNOSIS — I70248 Atherosclerosis of native arteries of left leg with ulceration of other part of lower left leg: Secondary | ICD-10-CM | POA: Diagnosis not present

## 2017-06-05 DIAGNOSIS — Z8249 Family history of ischemic heart disease and other diseases of the circulatory system: Secondary | ICD-10-CM | POA: Insufficient documentation

## 2017-06-05 HISTORY — PX: ABDOMINAL AORTOGRAM: CATH118222

## 2017-06-05 HISTORY — PX: PERIPHERAL VASCULAR BALLOON ANGIOPLASTY: CATH118281

## 2017-06-05 HISTORY — PX: LOWER EXTREMITY ANGIOGRAPHY: CATH118251

## 2017-06-05 LAB — POCT I-STAT, CHEM 8
BUN: 24 mg/dL — ABNORMAL HIGH (ref 6–20)
CALCIUM ION: 1.16 mmol/L (ref 1.15–1.40)
Chloride: 106 mmol/L (ref 101–111)
Creatinine, Ser: 0.8 mg/dL (ref 0.61–1.24)
Glucose, Bld: 96 mg/dL (ref 65–99)
HEMATOCRIT: 21 % — AB (ref 39.0–52.0)
Hemoglobin: 7.1 g/dL — ABNORMAL LOW (ref 13.0–17.0)
Potassium: 4.5 mmol/L (ref 3.5–5.1)
SODIUM: 138 mmol/L (ref 135–145)
TCO2: 26 mmol/L (ref 22–32)

## 2017-06-05 LAB — POCT ACTIVATED CLOTTING TIME
ACTIVATED CLOTTING TIME: 208 s
Activated Clotting Time: 186 seconds

## 2017-06-05 SURGERY — ABDOMINAL AORTOGRAM
Anesthesia: LOCAL

## 2017-06-05 MED ORDER — LIDOCAINE HCL (PF) 1 % IJ SOLN
INTRAMUSCULAR | Status: AC
  Filled 2017-06-05: qty 30

## 2017-06-05 MED ORDER — HEPARIN SODIUM (PORCINE) 1000 UNIT/ML IJ SOLN
INTRAMUSCULAR | Status: AC
  Filled 2017-06-05: qty 1

## 2017-06-05 MED ORDER — SODIUM CHLORIDE 0.9 % WEIGHT BASED INFUSION: 1.0000 mL/kg/h | INTRAVENOUS | Status: DC

## 2017-06-05 MED ORDER — SODIUM CHLORIDE 0.9 % IV SOLN: 250.0000 mL | INTRAVENOUS | Status: DC | PRN

## 2017-06-05 MED ORDER — HYDRALAZINE HCL 20 MG/ML IJ SOLN
INTRAMUSCULAR | Status: DC | PRN
  Administered 2017-06-05: 10 mg via INTRAVENOUS

## 2017-06-05 MED ORDER — HYDRALAZINE HCL 20 MG/ML IJ SOLN: 5.0000 mg | INTRAMUSCULAR | Status: DC | PRN

## 2017-06-05 MED ORDER — IODIXANOL 320 MG/ML IV SOLN
INTRAVENOUS | Status: DC | PRN
  Administered 2017-06-05: 215 mL via INTRA_ARTERIAL

## 2017-06-05 MED ORDER — LABETALOL HCL 5 MG/ML IV SOLN
10.0000 mg | INTRAVENOUS | Status: DC | PRN
  Administered 2017-06-05: 10 mg via INTRAVENOUS

## 2017-06-05 MED ORDER — HEPARIN (PORCINE) IN NACL 2-0.9 UNITS/ML
INTRAMUSCULAR | Status: AC | PRN
  Administered 2017-06-05 (×2): 500 mL

## 2017-06-05 MED ORDER — HEPARIN SODIUM (PORCINE) 1000 UNIT/ML IJ SOLN
INTRAMUSCULAR | Status: DC | PRN
  Administered 2017-06-05: 2000 [IU] via INTRAVENOUS
  Administered 2017-06-05: 4000 [IU] via INTRAVENOUS
  Administered 2017-06-05: 8000 [IU] via INTRAVENOUS

## 2017-06-05 MED ORDER — SODIUM CHLORIDE 0.9% FLUSH: 3.0000 mL | Freq: Two times a day (BID) | INTRAVENOUS | Status: DC

## 2017-06-05 MED ORDER — FENTANYL CITRATE (PF) 100 MCG/2ML IJ SOLN
INTRAMUSCULAR | Status: DC | PRN
  Administered 2017-06-05: 50 ug via INTRAVENOUS
  Administered 2017-06-05: 25 ug via INTRAVENOUS
  Administered 2017-06-05: 50 ug via INTRAVENOUS

## 2017-06-05 MED ORDER — HEPARIN (PORCINE) IN NACL 1000-0.9 UT/500ML-% IV SOLN
INTRAVENOUS | Status: AC
  Filled 2017-06-05: qty 1000

## 2017-06-05 MED ORDER — MIDAZOLAM HCL 2 MG/2ML IJ SOLN
INTRAMUSCULAR | Status: AC
  Filled 2017-06-05: qty 2

## 2017-06-05 MED ORDER — LABETALOL HCL 5 MG/ML IV SOLN
INTRAVENOUS | Status: AC
  Filled 2017-06-05: qty 4

## 2017-06-05 MED ORDER — FENTANYL CITRATE (PF) 100 MCG/2ML IJ SOLN
INTRAMUSCULAR | Status: AC
  Filled 2017-06-05: qty 2

## 2017-06-05 MED ORDER — LIDOCAINE HCL (PF) 1 % IJ SOLN
INTRAMUSCULAR | Status: DC | PRN
  Administered 2017-06-05: 15 mL

## 2017-06-05 MED ORDER — SODIUM CHLORIDE 0.9% FLUSH
3.0000 mL | INTRAVENOUS | Status: DC | PRN
Start: 2017-06-06 — End: 2017-06-05

## 2017-06-05 MED ORDER — HYDRALAZINE HCL 20 MG/ML IJ SOLN
INTRAMUSCULAR | Status: AC
  Filled 2017-06-05: qty 1

## 2017-06-05 MED ORDER — ACETAMINOPHEN 325 MG PO TABS: 650.0000 mg | ORAL_TABLET | ORAL | Status: DC | PRN

## 2017-06-05 MED ORDER — MIDAZOLAM HCL 2 MG/2ML IJ SOLN
INTRAMUSCULAR | Status: DC | PRN
  Administered 2017-06-05 (×2): 1 mg via INTRAVENOUS

## 2017-06-05 MED ORDER — MORPHINE SULFATE (PF) 10 MG/ML IV SOLN: 2.0000 mg | INTRAVENOUS | Status: DC | PRN

## 2017-06-05 MED ORDER — OXYCODONE HCL 5 MG PO TABS: 5.0000 mg | ORAL_TABLET | ORAL | Status: DC | PRN

## 2017-06-05 MED ORDER — ONDANSETRON HCL 4 MG/2ML IJ SOLN: 4.0000 mg | Freq: Four times a day (QID) | INTRAMUSCULAR | Status: DC | PRN

## 2017-06-05 MED ORDER — SODIUM CHLORIDE 0.9 % IV SOLN
INTRAVENOUS | Status: DC
  Administered 2017-06-05: 10:00:00 via INTRAVENOUS

## 2017-06-05 SURGICAL SUPPLY — 28 items
BALLN COYOTE OTW 3X220X150 (BALLOONS) ×4
BALLOON COYOTE OTW 3X220X150 (BALLOONS) ×3 IMPLANT
CATH ANGIO 5F BER2 65CM (CATHETERS) ×4 IMPLANT
CATH CXI 2.3F 150 ST (CATHETERS) ×4 IMPLANT
CATH OMNI FLUSH 5F 65CM (CATHETERS) ×4 IMPLANT
CATH OUTBACK ELITE RE-ENTRY (CATHETERS) ×4 IMPLANT
CATH QUICKCROSS .035X135CM (MICROCATHETER) ×4 IMPLANT
COVER PRB 48X5XTLSCP FOLD TPE (BAG) ×3 IMPLANT
COVER PROBE 5X48 (BAG) ×1
DEVICE CLOSURE MYNXGRIP 6/7F (Vascular Products) ×4 IMPLANT
DEVICE TORQUE .014-.018 (MISCELLANEOUS) ×3 IMPLANT
DEVICE TORQUE .025-.038 (MISCELLANEOUS) ×4 IMPLANT
GLIDEWIRE ADV .035X260CM (WIRE) ×4 IMPLANT
KIT ENCORE 26 ADVANTAGE (KITS) ×4 IMPLANT
KIT MICROPUNCTURE NIT STIFF (SHEATH) ×4 IMPLANT
KIT PV (KITS) ×4 IMPLANT
SHEATH HIGHFLEX ANSEL 7FR 55CM (SHEATH) ×4 IMPLANT
SHEATH PINNACLE 5F 10CM (SHEATH) ×4 IMPLANT
SHEATH PINNACLE 7F 10CM (SHEATH) ×4 IMPLANT
SYR MEDRAD MARK V 150ML (SYRINGE) ×4 IMPLANT
TORQUE DEVICE .014-.018 (MISCELLANEOUS) ×4
TRANSDUCER W/STOPCOCK (MISCELLANEOUS) ×4 IMPLANT
TRAY PV CATH (CUSTOM PROCEDURE TRAY) ×4 IMPLANT
WIRE ASAHI PROWATER 300CM (WIRE) ×4 IMPLANT
WIRE BENTSON .035X145CM (WIRE) ×4 IMPLANT
WIRE G V18X300CM (WIRE) ×4 IMPLANT
WIRE HI TORQ VERSACORE J 260CM (WIRE) ×4 IMPLANT
WIRE SPARTACORE .014X300CM (WIRE) ×8 IMPLANT

## 2017-06-05 NOTE — Progress Notes (Signed)
Pt sat up to do his breathing treatment and started oozing from his groin site.  Slow steady ooze from site . Dsg removed.  Pressure held for 15 min. Dr Donzetta Matters notified.  Pressure dsg applied.  Bedrest restarted for 2 hours. Explained to pt

## 2017-06-05 NOTE — Discharge Instructions (Signed)

## 2017-06-05 NOTE — H&P (Signed)
   History and Physical Update  The patient was interviewed and re-examined.  The patient's previous History and Physical has been reviewed and is unchanged from recent office visit.  Plan for aortogram with lower extreme runoff looking specifically at the left.  Corlene Sabia C. Donzetta Matters, MD [facility] Office: [phone] Pager: [phone]   06/05/2017, 11:23 AM

## 2017-06-05 NOTE — Op Note (Signed)
Patient name: Spencer Todd MRN: [id number] DOB: [birth year] Sex: male  06/05/2017 Pre-operative Diagnosis: Critical left lower extremity ischemia with leg wounds Post-operative diagnosis:  Same Surgeon:  Erlene Quan C. Donzetta Matters, MD Procedure Performed: 1.  Ultrasound-guided cannulation of right common femoral artery 2.  Aortogram with bilateral lower extremity runoff 3.  Plain balloon angioplasty of left peroneal artery and superficial femoral and popliteal arteries 4.  Moderate sedation with fentanyl and Versed for 115 minutes 5.  Minx closure device right common femoral artery   Indications: 84 year old male with left lower extremity wounds.  He has depressed ABIs and is indicated for angiogram possible intervention on the left.  Findings: Aorta and iliac segments were heavily tortuous which make up and over access very difficult.  The SFA is occluded just after its takeoff and reconstitutes and above-knee popliteal artery.  He has single-vessel runoff on the left side via the peroneal artery.  On the right side superficial femoral artery has multilevel stenoses but is in-line to the level of the popliteal and the TP trunk occludes the tibials are not definitively evaluated but appears to be peroneal possibly posterior tibial runoff on the right.  We were able to use a reentry device and get through to the peroneal artery and balloon angioplasty this.  We also balloon angioplasty the popliteal and SFA arteries but given the tortuosity in the iliac arteries were unable to get any larger devices including stents down the superficial femoral artery.  We subsequently lost wire access and try to get a stent to pass and could not regain wire access.  Given the patient's discomfort with the procedure as well as the length of time and contrast use we elected to stop the procedure  Patient will need consideration of left femoral to popliteal artery bypass versus retrograde accesses of peroneal artery and  balloon angioplasty and stenting of his SFA.   Procedure:  The patient was identified in the holding area and taken to room 8.  The patient was then placed supine on the table and prepped and draped in the usual sterile fashion.  A time out was called.  Ultrasound was used to evaluate the right common femoral artery which was noted to be pulsatile and patent.  An image was saved to the permanent record.  This was cannulated with micropuncture needle under direct visualization and a micropuncture sheath was placed.  We placed a Bentson wire but this would not go directly into the aorta.  We then placed a 5 French sheath and used a Glidewire advantage as well as bare catheter directed into the aorta given his tortuous iliacs.  We then performed aortogram with bilateral lower extremity runoff via a Omni Flush catheter with the above findings.  We then elected to cross the bifurcation again we used the Omni and the Glidewire advantage and this was quite difficult given the tortuosity.  We then placed a long 7 French sheath in the left common femoral artery.  We performed angled angiography of the left SFA which demonstrated a nub.  Patient was heparinized.  We then elected to cross the SFA using a quick cross catheter and Glidewire advantage.  We are able to get all the way to the distal SFA but could not reenter.  This was very difficult given the long segment as well as the heavy calcification.  We then used a an Outback reentry catheter and were able to get an 014 wire across into the below-knee popliteal artery and confirmed  access.  We also demonstrated disease in the peroneal artery which we then crossed.  We then used a long 3 mm balloon to balloon the peroneal followed by the entirety of popliteal and SFA.  We then elected to stent primarily the SFA and popliteal segments.  We initially chose a 5 x 120 Innova stent but this would not pass beyond the distal SFA.  We did damage the wire and had to remove it.  We  then placed the Glidewire advantage back to the stent but could not get it back down into the distal popliteal artery.  We used the quick cross catheter again.  We then exchanged for V 18 wire to try to get back in.  Using angiography we determined there was a channel but we could not selected.  Patient had was having difficulty with discomfort and also we had used significant contrast in fluoroscopy time.  With this we elected to terminate the procedure.  He will have to be considered for bypass on the left side versus a retrograde peroneal access.  Contrast: 215cc   Marieta Markov C. Donzetta Matters, MD [facility] Office: [phone] Pager: [phone]

## 2017-06-06 ENCOUNTER — Ambulatory Visit: Payer: Medicare Other | Admitting: Family

## 2017-06-06 ENCOUNTER — Telehealth: Payer: Self-pay | Admitting: Vascular Surgery

## 2017-06-06 ENCOUNTER — Encounter (HOSPITAL_COMMUNITY): Payer: Self-pay | Admitting: Vascular Surgery

## 2017-06-06 ENCOUNTER — Other Ambulatory Visit: Payer: Self-pay

## 2017-06-06 VITALS — BP 156/63 | HR 63 | Temp 97.1°F | Resp 16 | Ht 71.0 in | Wt 181.0 lb

## 2017-06-06 DIAGNOSIS — I83892 Varicose veins of left lower extremities with other complications: Secondary | ICD-10-CM

## 2017-06-06 DIAGNOSIS — I739 Peripheral vascular disease, unspecified: Secondary | ICD-10-CM

## 2017-06-06 MED FILL — Heparin Sod (Porcine)-NaCl IV Soln 1000 Unit/500ML-0.9%: INTRAVENOUS | Qty: 1000 | Status: AC

## 2017-06-06 NOTE — Progress Notes (Signed)
Unna boot application to left lower leg by LPN.  Pt to return next week for reapplication.

## 2017-06-06 NOTE — Progress Notes (Signed)
Vitals:   06/06/17 1426  BP: (!) 168/65  Pulse: 85  Resp: 16  Temp: (!) 97.1 F (36.2 C)  TempSrc: Oral  SpO2: 98%  Weight: 181 lb (82.1 kg)  Height: 5\' 11"  (1.803 m)

## 2017-06-06 NOTE — Telephone Encounter (Signed)
sch apt add on to 06-27-17 appt 2pm mapping s/p aortagram Bilat LE Runoff

## 2017-06-06 NOTE — Telephone Encounter (Signed)
-----   Message from Penni Homans, RN sent at 06/05/2017  2:30 PM EDT ----- Regarding: Appointment   ----- Message ----- From: Waynetta Sandy, MD Sent: 06/05/2017   2:19 PM To: [facility] [id number]  [name] [id number] [birth year]  06/05/2017 Pre-operative Diagnosis: Critical left lower extremity ischemia with leg wounds  Surgeon:  Eda Paschal. Donzetta Matters, MD  Procedure Performed: 1.  Ultrasound-guided cannulation of right common femoral artery 2.  Aortogram with bilateral lower extremity runoff 3.  Plain balloon angioplasty of left peroneal artery and superficial femoral and popliteal arteries 4.  Moderate sedation with fentanyl and Versed for 115 minutes 5.  Minx closure device right common femoral artery  F/u with Dr. Oneida Alar in 2-3 weeks with bilateral saphenous vein mapping for preop bypass discussion.

## 2017-06-11 ENCOUNTER — Telehealth: Payer: Self-pay | Admitting: Oncology

## 2017-06-11 ENCOUNTER — Encounter: Payer: Self-pay | Admitting: Oncology

## 2017-06-11 NOTE — Telephone Encounter (Signed)
New referral received from Dr. Gloriann Loan from [facility]. Pt has been scheduled to see Dr. Alen Blew on 6/6 at 11am. Pt aware to arrive 30 minutes early. Letter mailed to the pt.

## 2017-06-12 ENCOUNTER — Other Ambulatory Visit: Payer: Self-pay

## 2017-06-12 DIAGNOSIS — I739 Peripheral vascular disease, unspecified: Secondary | ICD-10-CM

## 2017-06-12 DIAGNOSIS — Z48812 Encounter for surgical aftercare following surgery on the circulatory system: Secondary | ICD-10-CM

## 2017-06-13 ENCOUNTER — Ambulatory Visit (INDEPENDENT_AMBULATORY_CARE_PROVIDER_SITE_OTHER): Payer: Medicare Other | Admitting: Family

## 2017-06-13 ENCOUNTER — Other Ambulatory Visit: Payer: Self-pay

## 2017-06-13 ENCOUNTER — Encounter: Payer: Self-pay | Admitting: Family

## 2017-06-13 VITALS — BP 157/62 | HR 66 | Temp 97.1°F | Resp 18 | Ht 71.0 in | Wt 184.0 lb

## 2017-06-13 DIAGNOSIS — I83813 Varicose veins of bilateral lower extremities with pain: Secondary | ICD-10-CM

## 2017-06-13 NOTE — Progress Notes (Signed)
Unna boot change by LPN today, return in a week for repeat unna boot change

## 2017-06-20 ENCOUNTER — Encounter: Payer: Self-pay | Admitting: Family

## 2017-06-20 ENCOUNTER — Telehealth: Payer: Self-pay | Admitting: Oncology

## 2017-06-20 ENCOUNTER — Encounter (HOSPITAL_COMMUNITY): Payer: Medicare Other

## 2017-06-20 ENCOUNTER — Ambulatory Visit: Payer: Medicare Other | Admitting: Family

## 2017-06-20 ENCOUNTER — Ambulatory Visit: Payer: Medicare Other | Admitting: Vascular Surgery

## 2017-06-20 ENCOUNTER — Inpatient Hospital Stay: Payer: Medicare Other | Attending: Oncology | Admitting: Oncology

## 2017-06-20 ENCOUNTER — Encounter: Payer: Self-pay | Admitting: Medical Oncology

## 2017-06-20 ENCOUNTER — Telehealth: Payer: Self-pay | Admitting: Pharmacist

## 2017-06-20 VITALS — BP 152/64 | HR 68 | Ht 71.0 in | Wt 184.5 lb

## 2017-06-20 VITALS — BP 157/58 | HR 68 | Temp 98.2°F | Resp 17 | Ht 71.0 in | Wt 183.3 lb

## 2017-06-20 DIAGNOSIS — I739 Peripheral vascular disease, unspecified: Secondary | ICD-10-CM | POA: Diagnosis not present

## 2017-06-20 DIAGNOSIS — Z79899 Other long term (current) drug therapy: Secondary | ICD-10-CM | POA: Diagnosis not present

## 2017-06-20 DIAGNOSIS — C7951 Secondary malignant neoplasm of bone: Secondary | ICD-10-CM

## 2017-06-20 DIAGNOSIS — Z192 Hormone resistant malignancy status: Secondary | ICD-10-CM | POA: Diagnosis not present

## 2017-06-20 DIAGNOSIS — N132 Hydronephrosis with renal and ureteral calculous obstruction: Secondary | ICD-10-CM

## 2017-06-20 DIAGNOSIS — L97821 Non-pressure chronic ulcer of other part of left lower leg limited to breakdown of skin: Principal | ICD-10-CM

## 2017-06-20 DIAGNOSIS — I83028 Varicose veins of left lower extremity with ulcer other part of lower leg: Secondary | ICD-10-CM

## 2017-06-20 DIAGNOSIS — C61 Malignant neoplasm of prostate: Secondary | ICD-10-CM | POA: Diagnosis not present

## 2017-06-20 MED ORDER — ENZALUTAMIDE 40 MG PO CAPS: 160.0000 mg | ORAL_CAPSULE | Freq: Every day | ORAL | 0 refills | Status: DC

## 2017-06-20 NOTE — Progress Notes (Signed)
Unna boot removal and reapplication by CMA. Return in a week for reapplication.

## 2017-06-20 NOTE — Telephone Encounter (Signed)
Oral Oncology Pharmacist Encounter  Received new prescription for Xtandi (enzalutamide) for the treatment of metastatic, castration-sensitive prostate cancer in conjunction with androgen deprivation therapy, planned duration until disease progression or unacceptable toxicity.  Labs from ambulatory referral performed on 04/17/17 assessed, OK for treatment. SCr=1.5, est CrCl ~40 mL/min, no dose adjustment recommended by manufacturer in renal dysfunction 71% of enzalutamide is excreted in the urine Patient will be closely monitored for adverse effects.  BPs in Epic reviewed, all readings above normal limits Will discuss with patient need for BP control and risk of increase in blood pressure with Xtandi use.  Current medication list in Epic reviewed, no DDIs with Xtandi identified:  Medication list includes several vitamins and supplements. No interactions with Xtandi where discovered.  Prescription will be e-scribed to appropriate Specialty Pharmacy once insurance authorization is obtained.  Oral Oncology Clinic will continue to follow for insurance authorization, copayment issues, initial counseling and start date.  Johny Drilling, PharmD, BCPS, BCOP  06/20/2017 3:25 PM Oral Oncology Clinic [phone]

## 2017-06-20 NOTE — Telephone Encounter (Signed)
Oral Chemotherapy Pharmacist Encounter   I spoke with patient and wife in scheduling area for overview of: Xtandi.   Counseled patient on administration, dosing, side effects, monitoring, drug-food interactions, safe handling, storage, and disposal.  Patient will take Xtandi 40mg  capsules, 4 capsules (160mg ) by mouth once daily without regard to food.  Xtandi start date: TBD, pending medication acquisition  Adverse effects include but are not limited to: peripheral edema, GI upset, hypertension, hot flashes, fatigue, falls/fractures, and arthralgias.   Patient instructed about small risk of seizures with Xtandi treatment.  Reviewed with patient importance of keeping a medication schedule and plan for any missed doses.  Mr. And Mrs. Haun voiced understanding and appreciation.   All questions answered. Medication reconciliation performed and medication/allergy list updated.  We will follow-up with patient regarding insurance and pharmacy once authorization is obtained and copayment is known. We extensively discussed Medicare copayment structure and available options for copayment assistance. Patient signed application for manufacturer assistance in case it is needed.  Patient knows to call the office with questions or concerns.  Oral Oncology Clinic will continue to follow.  Thank you,  Johny Drilling, PharmD, BCPS, BCOP  06/20/2017   3:36 PM Oral Oncology Clinic [phone]

## 2017-06-20 NOTE — Telephone Encounter (Signed)
Appointments scheduled AVS/Calendar printed per 6/6 los °

## 2017-06-20 NOTE — Progress Notes (Signed)
Reason for Referral: Prostate cancer  HPI: 84 year old gentleman currently resides in [place] where he lived the 60 of his life.  He has history of peripheral vascular disease and follows with vascular surgery in [place].  He started developing symptoms of unexplained weight loss, difficulty urination as well as diffuse pain.  He was evaluated locally and was noted to have alkaline phosphatase and a CT scan of the abdomen and pelvis as well as bone scan was obtained in March 2019.  The bone scan showed a scattered innumerable foci of increased uptake throughout the axial and appendicular skeleton.  CT scan of the abdomen and pelvis confirmed these findings of diffuse bony metastasis as well as bilateral hydronephrosis without any lymphadenopathy.  His bladder was markedly distended the prostate was enlarged.  These were completed on April 12, 2017 in [place].    Based on these findings, he was evaluated by Dr. Gloriann Loan at [facility] urology.  He had a catheter placed has been in place since that time.  His PSA was checked at the time was 2700 and subsequently underwent a prostate biopsy on 04/25/2017 which showed Gleason score 4+5 = 9 and at least 5 cores.  He was started on Firmagon as well as Xgeva by Dr. Gloriann Loan.  Repeat PSA showed a declining number down to 200 range.  After his Firmagon injection, he did report improvement in his appetite and his overall well-being.  He continues to have a Foley catheter in place and does not report any other complaints.  He denies any bone pain or pathological fractures.  His appetite is improving at this time.  His mobility is limited because of peripheral vascular disease however.  He does use a cane and a walker at home.  He did have an ultrasound-guided cannulation of his right common femoral vein on Jun 05, 2017.  He does not report any headaches, blurry vision, syncope or seizures. Does not report any fevers, chills or sweats.  Does not report any  cough, wheezing or hemoptysis.  Does not report any chest pain, palpitation, orthopnea or leg edema.  Does not report any nausea, vomiting or abdominal pain.  Does not report any constipation or diarrhea.  Does not report any skeletal complaints.    Does not report frequency, urgency or hematuria.  Does not report any skin rashes or lesions. Does not report any heat or cold intolerance.  Does not report any lymphadenopathy or petechiae.  Does not report any anxiety or depression.  Remaining review of systems is negative.    Past Medical History:  Diagnosis Date  . Atherosclerotic vascular disease   . Varicose veins   . Venous insufficiency   :  Past Surgical History:  Procedure Laterality Date  . ABDOMINAL AORTOGRAM N/A 06/05/2017   Procedure: ABDOMINAL AORTOGRAM;  Surgeon: Waynetta Sandy, MD;  Location: [facility];  Service: Cardiovascular;  Laterality: N/A;  . APPENDECTOMY  1976  . HERNIA REPAIR    . LOWER EXTREMITY ANGIOGRAPHY Bilateral 06/05/2017   Procedure: Lower Extremity Angiography;  Surgeon: Waynetta Sandy, MD;  Location: [facility];  Service: Cardiovascular;  Laterality: Bilateral;  Bilateral   . PERIPHERAL VASCULAR BALLOON ANGIOPLASTY  06/05/2017   Procedure: PERIPHERAL VASCULAR BALLOON ANGIOPLASTY;  Surgeon: Waynetta Sandy, MD;  Location: [facility];  Service: Cardiovascular;;  SFA and Peroneal  :   Current Outpatient Medications:  .  Acetylcarnitine HCl (ACETYL L-CARNITINE PO), Take 1 capsule by mouth 2 (two) times  daily., Disp: , Rfl:  .  Acetylcysteine (NAC) 600 MG CAPS, Take 600 mg by mouth 3 (three) times daily., Disp: , Rfl:  .  albuterol (PROVENTIL HFA;VENTOLIN HFA) 108 (90 Base) MCG/ACT inhaler, Inhale 1 puff into the lungs every 6 (six) hours as needed for wheezing or shortness of breath., Disp: , Rfl:  .  Amino Acids (L-CARNITINE PO), Take 1 capsule by mouth 3 (three) times daily. UPLC Supplement, Disp: , Rfl:   .  b complex vitamins tablet, Take 1 tablet by mouth 2 (two) times daily., Disp: , Rfl:  .  CALCIUM CITRATE PO, Take 2 tablets by mouth 2 (two) times daily., Disp: , Rfl:  .  Coenzyme Q10 (CO Q 10) 100 MG CAPS, Take 100 mg by mouth 4 (four) times daily., Disp: , Rfl:  .  Cranberry (CRAN-MAX PO), Take 1 capsule by mouth daily., Disp: , Rfl:  .  D-Ribose (RIBOSE, D,) POWD, Take 1 Scoop by mouth 3 (three) times daily., Disp: , Rfl:  .  enzalutamide (XTANDI) 40 MG capsule, Take 4 capsules (160 mg total) by mouth daily., Disp: 120 capsule, Rfl: 0 .  MAGNESIUM PO, Take 1 tablet by mouth 3 (three) times daily. , Disp: , Rfl:  .  Misc Natural Products (TART CHERRY ADVANCED PO), Take 1 capsule by mouth 2 (two) times daily., Disp: , Rfl:  .  Multiple Vitamins-Minerals (ANTIOXIDANT PO), Take 1 tablet by mouth 2 (two) times daily., Disp: , Rfl:  .  Multiple Vitamins-Minerals (MULTIVITAMIN PO), Take 2 tablets by mouth daily., Disp: , Rfl:  .  Nattokinase 100 MG CAPS, Take 100 mg by mouth daily. , Disp: , Rfl:  .  OVER THE COUNTER MEDICATION, Take 1 capsule by mouth daily. Gamma E Supplement, Disp: , Rfl:  .  Pomegranate, Punica granatum, (POMEGRANATE EXTRACT PO), Take 1 capsule by mouth 2 (two) times daily., Disp: , Rfl:  .  Probiotic Product (PROBIOTIC PO), Take 1 capsule by mouth daily., Disp: , Rfl:  .  Sodium Chloride-Xylitol (XLEAR SINUS CARE SPRAY NA), Place 1-2 sprays into both nostrils 2 (two) times daily as needed (for congestion)., Disp: , Rfl:  .  TURMERIC CURCUMIN PO, Take 1 capsule by mouth daily., Disp: , Rfl:  .  UBIQUINOL PO, Take 1 capsule by mouth daily., Disp: , Rfl:  .  vitamin C (ASCORBIC ACID) 500 MG tablet, Take 1,000 mg by mouth 3 (three) times daily., Disp: , Rfl: :  Allergies  Allergen Reactions  . Penicillins Hives, Other (See Comments) and Hypertension    Has patient had a PCN reaction causing immediate rash, facial/tongue/throat swelling, SOB or lightheadedness with  hypotension: Yes Has patient had a PCN reaction causing severe rash involving mucus membranes or skin necrosis: No Has patient had a PCN reaction that required hospitalization: No Has patient had a PCN reaction occurring within the last 10 years: No If all of the above answers are "NO", then may proceed with Cephalosporin use.   . Tetracyclines & Related Hives  . Vancomycin Other (See Comments)    Upset stomach  :  Family History  Problem Relation Age of Onset  . Hypertension Mother   . Heart disease Mother   . Heart disease Father        before age 29  . Heart attack Father   . Heart disease Brother   . Hyperlipidemia Brother   . Heart attack Brother   :  Social History   Socioeconomic History  . Marital status:  Married    Spouse name: Not on file  . Number of children: Not on file  . Years of education: Not on file  . Highest education level: Not on file  Occupational History  . Not on file  Social Needs  . Financial resource strain: Not on file  . Food insecurity:    Worry: Not on file    Inability: Not on file  . Transportation needs:    Medical: Not on file    Non-medical: Not on file  Tobacco Use  . Smoking status: Never Smoker  . Smokeless tobacco: Never Used  Substance and Sexual Activity  . Alcohol use: No    Alcohol/week: 0.0 oz  . Drug use: No  . Sexual activity: Not on file  Lifestyle  . Physical activity:    Days per week: Not on file    Minutes per session: Not on file  . Stress: Not on file  Relationships  . Social connections:    Talks on phone: Not on file    Gets together: Not on file    Attends religious service: Not on file    Active member of club or organization: Not on file    Attends meetings of clubs or organizations: Not on file    Relationship status: Not on file  . Intimate partner violence:    Fear of current or ex partner: Not on file    Emotionally abused: Not on file    Physically abused: Not on file    Forced sexual  activity: Not on file  Other Topics Concern  . Not on file  Social History Narrative  . Not on file  :  Pertinent items are noted in HPI.  Exam: Blood pressure (!) 157/58, pulse 68, temperature 98.2 F (36.8 C), temperature source Oral, resp. rate 17, height 5\' 11"  (1.803 m), weight 183 lb 4.8 oz (83.1 kg), SpO2 100 %.   General appearance: Elderly frail gentleman without distress. Head: atraumatic without any abnormalities. Eyes: conjunctivae/corneas clear. PERRL.  Sclera anicteric. Throat: lips, mucosa, and tongue normal; without oral thrush or ulcers. Resp: clear to auscultation bilaterally.  Expiratory wheezes noted bilaterally. Cardio: regular rate and rhythm, S1, S2 normal, no murmur, click, rub or gallop GI: soft, non-tender; bowel sounds normal; no masses,  no organomegaly Skin: Skin color, texture, turgor normal. No rashes or lesions Lymph nodes: Cervical, supraclavicular, and axillary nodes normal. Neurologic: Grossly normal without any motor, sensory or deep tendon reflexes. Musculoskeletal: No joint deformity or effusion.  CBC    Component Value Date/Time   HGB 7.1 (L) 06/05/2017 1019   HCT 21.0 (L) 06/05/2017 1019     Chemistry      Component Value Date/Time   NA 138 06/05/2017 1019   K 4.5 06/05/2017 1019   CL 106 06/05/2017 1019   BUN 24 (H) 06/05/2017 1019   CREATININE 0.80 06/05/2017 1019   No results found for: CALCIUM, ALKPHOS, AST, ALT, BILITOT     Assessment and Plan:   84 year old gentleman with the following issues:  1.  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  He presented with a PSA of 2718 and a Gleason score of 4+5 = 9.  He has diffuse osseous metastatic disease without any lymphadenopathy or visceral metastasis.  He was started on androgen deprivation therapy by Dr. Gloriann Loan.  The natural course of this disease was reviewed today with the patient as well as treatment options.  He understands he has an incurable  malignancy but disease that can potentially palliated for a period of time.  He has clearly high risk features including high PSA and high volume of disease including bone involvement.  Androgen deprivation therapy is the gold standard to treat his disease and which will be continued indefinitely.  The rationale for adding therapy to his androgen deprivation was discussed today.  These options at this time including Taxotere chemotherapy, Zytiga in addition to Clever.  Risks and benefits of all these approaches were reviewed today as well as potential complications.  Given his clinical status, I feel [name] is his best choice with better toxicity profile.  Complication associated with this medication including fatigue, tiredness, edema, hypertension and rarely seizures.  The benefit would include improvement in overall survival as well as a little as metastatic free survival.  After discussion today, he is agreeable to proceed with this treatment if his insurance coverage is appropriate.  2.  Androgen deprivation: I recommended continuing this indefinitely.  He is currently receiving under the care of Dr. Gloriann Loan.  3.  Bone directed therapy: He is at risk of developing skeletal related events and I recommended continuing Xgeva at this time.  4.  Hydronephrosis: Related to his advanced prostate cancer.  He has follow-up with Dr. Gloriann Loan for an ultrasound in the future.  He still has a Foley catheter in place which will be managed in the future.  5.  Follow-up: We will be in the next month or so to follow his progress and assess complications related to [facility].  60  minutes was spent with the patient face-to-face today.  More than 50% of time was dedicated to patient counseling, education and coordination of his care.

## 2017-06-20 NOTE — Progress Notes (Signed)
Introduced myself to patient and his wife as the prostate nurse navigator and my role. Spencer Todd was diagnosed with prostate in April and started ADT 05/09/17. After seeing Dr. Alen Blew, [name] drug information was given to patient. They are aware the oral chemo pharmacist will contact them to discuss insurance coverage of Xtandi. I gave them my business card and asked them to call with questions or concerns.

## 2017-06-21 ENCOUNTER — Telehealth: Payer: Self-pay | Admitting: Pharmacy Technician

## 2017-06-21 NOTE — Telephone Encounter (Signed)
Oral Oncology Patient Advocate Encounter  Received notification from [facility] that prior authorization for [name] is required.  PA submitted on CoverMyMeds Key [id number] Status is pending  Oral Oncology Clinic will continue to follow.   [place] Patient [place] Phone [phone] Fax [phone] 06/21/2017 8:32 AM

## 2017-06-21 NOTE — Telephone Encounter (Signed)
Oral Oncology Patient Advocate Encounter  Received notification from [facility] that the request for prior authorization for Spencer Todd has been denied due to the patient having castration-sensitive prostate cancer.  An abstract of the ARCHES trial was submitted with the original request. However, the insurance states that this contains insufficient data to support the approval of the requested drug.      This determination will be appealed if additional references from accepted sources can be found.  This encounter will continue to be updated until final determination.    [name] Patient [place] Phone [phone] Fax [phone] 06/21/2017 9:39 AM

## 2017-06-25 NOTE — Telephone Encounter (Signed)
Oral Oncology Pharmacist Encounter  Urgent appeal for [facility] faxed to [facility] Department at [phone]. PBM has 72 hours to make final decision on appeal request.  I have called and updated patient about insurance process.  Patient expressed understanding and appreciation.  This encounter will continue to be updated until final determination.  Johny Drilling, PharmD, BCPS, BCOP  06/25/2017 12:06 PM Oral Oncology Clinic [phone]

## 2017-06-26 NOTE — Telephone Encounter (Signed)
Oral Oncology Pharmacist Encounter  Received additional form from [place] healthcare appeals department to complete and send back for urgent appeal request.  Form completed and signed by MD. I included supporting literature and statement of medical necessity that was submitted with request on 06/25/2017.  New appeal packet faxed back to [phone] to the attention of [place].  This encounter will continue to be updated until final determination.  Johny Drilling, PharmD, BCPS, BCOP  06/26/2017 10:21 AM Oral Oncology Clinic [phone]

## 2017-06-27 ENCOUNTER — Ambulatory Visit (INDEPENDENT_AMBULATORY_CARE_PROVIDER_SITE_OTHER): Payer: Medicare Other | Admitting: Vascular Surgery

## 2017-06-27 ENCOUNTER — Other Ambulatory Visit: Payer: Self-pay

## 2017-06-27 ENCOUNTER — Encounter: Payer: Self-pay | Admitting: Vascular Surgery

## 2017-06-27 ENCOUNTER — Ambulatory Visit (HOSPITAL_COMMUNITY)
Admission: RE | Admit: 2017-06-27 | Discharge: 2017-06-27 | Disposition: A | Discharge status: Home / Self Care | Payer: Medicare Other | Source: Ambulatory Visit | Attending: Vascular Surgery | Admitting: Vascular Surgery

## 2017-06-27 VITALS — BP 186/69 | HR 66 | Resp 18 | Ht 71.0 in | Wt 180.0 lb

## 2017-06-27 DIAGNOSIS — I739 Peripheral vascular disease, unspecified: Secondary | ICD-10-CM

## 2017-06-27 DIAGNOSIS — R0602 Shortness of breath: Secondary | ICD-10-CM

## 2017-06-27 DIAGNOSIS — I6523 Occlusion and stenosis of bilateral carotid arteries: Secondary | ICD-10-CM | POA: Diagnosis not present

## 2017-06-27 DIAGNOSIS — R200 Anesthesia of skin: Secondary | ICD-10-CM | POA: Diagnosis not present

## 2017-06-27 DIAGNOSIS — Z48812 Encounter for surgical aftercare following surgery on the circulatory system: Secondary | ICD-10-CM | POA: Diagnosis not present

## 2017-06-27 NOTE — Progress Notes (Signed)
Patient name: Spencer Todd MRN: [id number] DOB: 12-07-1932 Sex: male  HPI: Spencer Todd is a 84 y.o. male who returns for follow-up today to recheck a wound in his left leg.  He has been receiving Unna boot therapies for the last 2 weeks.  He has a mixed arterial and venous component.  He underwent angioplasty of the left superficial femoral popliteal and peroneal arteries by my partner Dr. Donzetta Matters on May 22 to assist in healing these wounds.  The patient overall is very debilitated with fairly extensive metastatic prostate cancer.  He is quite frail.  He has had several falls recently.  He actually fell in our office today and bumped his left shoulder.  He apparently had a fall recently that was significant enough that he was almost knocked unconscious and was evaluated at the [facility] emergency room.  He still has bilateral numbness and tingling in both upper extremities.  I did review his CT scan of the cervical spine today.  Which showed diffuse arthritic change and some narrowing of the central canal but no fracture obvious compromise.  He states that he has had numbness and tingling in his hands since the fall.  Also complains today of shortness of breath.  He has a history in the past of asthma and for several years had not really had any significant problems.  However, he states that recently he has become more short of breath at rest and with exertion.  His oxygen saturations were reasonable today.  Past Medical History:  Diagnosis Date  . Atherosclerotic vascular disease   . Varicose veins   . Venous insufficiency    Past Surgical History:  Procedure Laterality Date  . ABDOMINAL AORTOGRAM N/A 06/05/2017   Procedure: ABDOMINAL AORTOGRAM;  Surgeon: Waynetta Sandy, MD;  Location: [facility];  Service: Cardiovascular;  Laterality: N/A;  . APPENDECTOMY  1976  . HERNIA REPAIR    . LOWER EXTREMITY ANGIOGRAPHY Bilateral 06/05/2017   Procedure: Lower Extremity Angiography;   Surgeon: Waynetta Sandy, MD;  Location: [facility];  Service: Cardiovascular;  Laterality: Bilateral;  Bilateral   . PERIPHERAL VASCULAR BALLOON ANGIOPLASTY  06/05/2017   Procedure: PERIPHERAL VASCULAR BALLOON ANGIOPLASTY;  Surgeon: Waynetta Sandy, MD;  Location: [facility];  Service: Cardiovascular;;  SFA and Peroneal    Family History  Problem Relation Age of Onset  . Hypertension Mother   . Heart disease Mother   . Heart disease Father        before age 52  . Heart attack Father   . Heart disease Brother   . Hyperlipidemia Brother   . Heart attack Brother     SOCIAL HISTORY: Social History   Socioeconomic History  . Marital status: Married    Spouse name: Not on file  . Number of children: Not on file  . Years of education: Not on file  . Highest education level: Not on file  Occupational History  . Not on file  Social Needs  . Financial resource strain: Not on file  . Food insecurity:    Worry: Not on file    Inability: Not on file  . Transportation needs:    Medical: Not on file    Non-medical: Not on file  Tobacco Use  . Smoking status: Never Smoker  . Smokeless tobacco: Never Used  Substance and Sexual Activity  . Alcohol use: No    Alcohol/week: 0.0 oz  . Drug use: No  .  Sexual activity: Not on file  Lifestyle  . Physical activity:    Days per week: Not on file    Minutes per session: Not on file  . Stress: Not on file  Relationships  . Social connections:    Talks on phone: Not on file    Gets together: Not on file    Attends religious service: Not on file    Active member of club or organization: Not on file    Attends meetings of clubs or organizations: Not on file    Relationship status: Not on file  . Intimate partner violence:    Fear of current or ex partner: Not on file    Emotionally abused: Not on file    Physically abused: Not on file    Forced sexual activity: Not on file  Other Topics Concern  .  Not on file  Social History Narrative  . Not on file    Allergies  Allergen Reactions  . Penicillins Hives, Other (See Comments) and Hypertension    Has patient had a PCN reaction causing immediate rash, facial/tongue/throat swelling, SOB or lightheadedness with hypotension: Yes Has patient had a PCN reaction causing severe rash involving mucus membranes or skin necrosis: No Has patient had a PCN reaction that required hospitalization: No Has patient had a PCN reaction occurring within the last 10 years: No If all of the above answers are "NO", then may proceed with Cephalosporin use.   . Tetracyclines & Related Hives  . Vancomycin Other (See Comments)    Upset stomach    Current Outpatient Medications  Medication Sig Dispense Refill  . Acetylcarnitine HCl (ACETYL L-CARNITINE PO) Take 1 capsule by mouth 2 (two) times daily.    . Acetylcysteine (NAC) 600 MG CAPS Take 600 mg by mouth 3 (three) times daily.    Marland Kitchen albuterol (PROVENTIL HFA;VENTOLIN HFA) 108 (90 Base) MCG/ACT inhaler Inhale 1 puff into the lungs every 6 (six) hours as needed for wheezing or shortness of breath.    . Amino Acids (L-CARNITINE PO) Take 1 capsule by mouth 3 (three) times daily. UPLC Supplement    . b complex vitamins tablet Take 1 tablet by mouth 2 (two) times daily.    Marland Kitchen CALCIUM CITRATE PO Take 2 tablets by mouth 2 (two) times daily.    . Coenzyme Q10 (CO Q 10) 100 MG CAPS Take 100 mg by mouth 4 (four) times daily.    . Cranberry (CRAN-MAX PO) Take 1 capsule by mouth daily.    Marland Kitchen D-Ribose (RIBOSE, D,) POWD Take 1 Scoop by mouth 3 (three) times daily.    . enzalutamide (XTANDI) 40 MG capsule Take 4 capsules (160 mg total) by mouth daily. 120 capsule 0  . MAGNESIUM PO Take 1 tablet by mouth 3 (three) times daily.     . Misc Natural Products (TART CHERRY ADVANCED PO) Take 1 capsule by mouth 2 (two) times daily.    . Multiple Vitamins-Minerals (ANTIOXIDANT PO) Take 1 tablet by mouth 2 (two) times daily.    .  Multiple Vitamins-Minerals (MULTIVITAMIN PO) Take 2 tablets by mouth daily.    . Nattokinase 100 MG CAPS Take 100 mg by mouth daily.     Marland Kitchen OVER THE COUNTER MEDICATION Take 1 capsule by mouth daily. Gamma E Supplement    . Pomegranate, Punica granatum, (POMEGRANATE EXTRACT PO) Take 1 capsule by mouth 2 (two) times daily.    . Probiotic Product (PROBIOTIC PO) Take 1 capsule by mouth daily.    Marland Kitchen  Sodium Chloride-Xylitol (XLEAR SINUS CARE SPRAY NA) Place 1-2 sprays into both nostrils 2 (two) times daily as needed (for congestion).    . TURMERIC CURCUMIN PO Take 1 capsule by mouth daily.    Marland Kitchen UBIQUINOL PO Take 1 capsule by mouth daily.    . vitamin C (ASCORBIC ACID) 500 MG tablet Take 1,000 mg by mouth 3 (three) times daily.     No current facility-administered medications for this visit.     ROS:   General:  + weight loss, no fever, no chills  HEENT: No recent headaches, no nasal bleeding, no visual changes, no sore throat  Neurologic: No dizziness, blackouts, seizures. No recent symptoms of stroke or mini- stroke. No recent episodes of slurred speech, or temporary blindness.  Cardiac: No recent episodes of chest pain/pressure, no shortness of breath at rest.  + shortness of breath with exertion.  Denies history of atrial fibrillation or irregular heartbeat  Vascular: No history of rest pain in feet.  No history of claudication.  + history of non-healing ulcer, No history of DVT   Pulmonary: No home oxygen, no productive cough, no hemoptysis,  + asthma or wheezing  Musculoskeletal:  [X]  Arthritis, [X]  Low back pain,  [X]  Joint pain  Hematologic:No history of hypercoagulable state.  No history of easy bleeding.  No history of anemia  Gastrointestinal: No hematochezia or melena,  No gastroesophageal reflux, no trouble swallowing  Urinary: [ ]  x chronic Kidney disease, [ ]  on HD - [ ]  MWF or [ ]  TTHS, [ ]  Burning with urination, [ ]  Frequent urination, [ ]  Difficulty urinating;   Skin: No  rashes  Psychological: No history of anxiety,  No history of depression   Physical Examination  Vitals:   06/27/17 1521  BP: (!) 186/69  Pulse: 66  Resp: 18  SpO2: 99%  Weight: 180 lb (81.6 kg)  Height: 5\' 11"  (1.803 m)    Body mass index is 25.1 kg/m.  General:  Alert and oriented, no acute distress HEENT: Normal Neck: No bruit or JVD Pulmonary: Clear to auscultation bilaterally Cardiac: Regular Rate and Rhythm without murmur Skin: No rash, 3 x 3 cm shallow ulceration left pretibial region no surrounding inflammation  extremity Pulses:  2+ radial, brachial, femoral, absent dorsalis pedis, posterior tibial pulses bilaterally Musculoskeletal: No deformity 2+ pretibial left leg edema, I examined his left shoulder area where he apparently fell towards the desk in our exam room.  There is no bony tenderness or deformity over the left shoulder left clavicle left humeral region.  There is no ecchymosis of the skin.  Neurologic: Upper and lower extremity motor 5/5 and symmetric  DATA: he had a vein mapping ultrasound today of his left and right greater saphenous vein.  The vein was greater than 3 mm primarily 4 to 5 mm throughout most of the leg bilaterally.  ASSESSMENT: Slowly healing left pretibial ulcer most likely venous in character but also with an arterial component.  This appears to be healing.  Numbness tingling upper extremities probably related to cervical spine chronic disease  Dyspnea on exertion no obvious wheezing but with history of asthma and diffuse cancer   PLAN: #1 we will do another course of Unna boot therapy changing his Unna boot once a week for the next 4 weeks and I will see him in 1 month.  He is not really a very good candidate for revascularization due to his overall frailty and progression of his cancer disease.  Hopefully this  will heal with conservative measures.  I would consider a repeat arteriogram on him if things got worse but I do not think he  is a candidate for an open revascularization.  2.  As far as the numbness and tingling is concerned that most likely this is probably inflammatory from his degenerative arthritis.  But we will try to get him an appointment with Dr. Trenton Gammon in the near future with neurosurgery to see if there may be any sort of medical or injection treatment that may help with his symptoms.  Again he is not a very good candidate for any sort of operation.  3.  Dyspnea on exertion the patient really does not have a primary care physician currently.  However, since he has fairly diffuse cancer we will have him seen by his oncologist Dr. Alen Blew to see if his shortness of breath may be related to this.  Ruta Hinds, MD [facility] Office: [phone] Pager: [phone]

## 2017-06-28 ENCOUNTER — Other Ambulatory Visit: Payer: Self-pay

## 2017-06-28 NOTE — Telephone Encounter (Signed)
Oral Oncology Patient Advocate Encounter  Prior Authorization for [name] has been approved.    PA# [id number] Effective dates: 06/28/2017 through 06/29/2018  Oral Oncology Clinic will continue to follow.   Fabio Asa. Melynda Keller, [name] Patient [place] [phone] 06/28/2017 10:50 AM

## 2017-07-01 ENCOUNTER — Other Ambulatory Visit: Payer: Self-pay

## 2017-07-01 DIAGNOSIS — R0602 Shortness of breath: Secondary | ICD-10-CM

## 2017-07-02 ENCOUNTER — Telehealth: Payer: Self-pay | Admitting: Pharmacy Technician

## 2017-07-02 NOTE — Telephone Encounter (Signed)
Oral Oncology Patient Advocate Encounter   Patient's copayment for the initial month of therapy is $2456.88.  I have submitted a previously signed application for [employer] in an effort to reduce the patient's out of pocket expense for Xtandi to $0.    Application completed and faxed to [phone].   [place] phone number for follow up is [phone].   This encounter will be updated until final determination.  Fabio Asa. Melynda Keller, [place] Patient [place] [phone] 07/02/2017 2:19 PM

## 2017-07-03 NOTE — Telephone Encounter (Signed)
Oral Oncology Patient Advocate Encounter  Received notification from [place] Patient Assistance program that patient has been successfully enrolled into their program to receive Xtandi from the manufacturer at $0 out of pocket until 01/14/2018.   I called and left a message for the patient with the details of his enrollment.  Gilmore Laroche, CPhT, [place] Oral Oncology Patient Advocate [phone] 07/03/2017 2:44 PM

## 2017-07-04 ENCOUNTER — Ambulatory Visit (INDEPENDENT_AMBULATORY_CARE_PROVIDER_SITE_OTHER): Payer: Self-pay | Admitting: Family

## 2017-07-04 ENCOUNTER — Encounter: Payer: Self-pay | Admitting: Family

## 2017-07-04 ENCOUNTER — Other Ambulatory Visit: Payer: Self-pay

## 2017-07-04 VITALS — BP 171/74 | HR 67 | Resp 20 | Ht 71.0 in | Wt 174.0 lb

## 2017-07-04 DIAGNOSIS — L97929 Non-pressure chronic ulcer of unspecified part of left lower leg with unspecified severity: Secondary | ICD-10-CM

## 2017-07-04 DIAGNOSIS — R609 Edema, unspecified: Secondary | ICD-10-CM

## 2017-07-04 DIAGNOSIS — I83029 Varicose veins of left lower extremity with ulcer of unspecified site: Secondary | ICD-10-CM

## 2017-07-04 DIAGNOSIS — I779 Disorder of arteries and arterioles, unspecified: Secondary | ICD-10-CM

## 2017-07-04 DIAGNOSIS — I83892 Varicose veins of left lower extremities with other complications: Secondary | ICD-10-CM

## 2017-07-04 NOTE — Patient Instructions (Addendum)
To decrease swelling in your feet and legs: Elevate feet above slightly bent knees, feet above heart, overnight and 3-4 times per day for 20 minutes.    Peripheral Vascular Disease Peripheral vascular disease (PVD) is a disease of the blood vessels that are not part of your heart and brain. A simple term for PVD is poor circulation. In most cases, PVD narrows the blood vessels that carry blood from your heart to the rest of your body. This can result in a decreased supply of blood to your arms, legs, and internal organs, like your stomach or kidneys. However, it most often affects a person's lower legs and feet. There are two types of PVD.  Organic PVD. This is the more common type. It is caused by damage to the structure of blood vessels.  Functional PVD. This is caused by conditions that make blood vessels contract and tighten (spasm).  Without treatment, PVD tends to get worse over time. PVD can also lead to acute ischemic limb. This is when an arm or limb suddenly has trouble getting enough blood. This is a medical emergency. Follow these instructions at home:  Take medicines only as told by your doctor.  Do not use any tobacco products, including cigarettes, chewing tobacco, or electronic cigarettes. If you need help quitting, ask your doctor.  Lose weight if you are overweight, and maintain a healthy weight as told by your doctor.  Eat a diet that is low in fat and cholesterol. If you need help, ask your doctor.  Exercise regularly. Ask your doctor for some good activities for you.  Take good care of your feet. ? Wear comfortable shoes that fit well. ? Check your feet often for any cuts or sores. Contact a doctor if:  You have cramps in your legs while walking.  You have leg pain when you are at rest.  You have coldness in a leg or foot.  Your skin changes.  You are unable to get or have an erection (erectile dysfunction).  You have cuts or sores on your feet that  are not healing. Get help right away if:  Your arm or leg turns cold and blue.  Your arms or legs become red, warm, swollen, painful, or numb.  You have chest pain or trouble breathing.  You suddenly have weakness in your face, arm, or leg.  You become very confused or you cannot speak.  You suddenly have a very bad headache.  You suddenly cannot see. This information is not intended to replace advice given to you by your health care provider. Make sure you discuss any questions you have with your health care provider. Document Released: 03/28/2009 Document Revised: 06/09/2015 Document Reviewed: 06/11/2013 Elsevier Interactive Patient Education  2017 [employer] Inc.     Chronic Venous Insufficiency Chronic venous insufficiency, also called venous stasis, is a condition that prevents blood from being pumped effectively through the veins in your legs. Blood may no longer be pumped effectively from the legs back to the heart. This condition can range from mild to severe. With proper treatment, you should be able to continue with an active life. What are the causes? Chronic venous insufficiency occurs when the vein walls become stretched, weakened, or damaged, or when valves within the vein are damaged. Some common causes of this include:  High blood pressure inside the veins (venous hypertension).  Increased blood pressure in the leg veins from long periods of sitting or standing.  A blood clot that blocks blood flow  in a vein (deep vein thrombosis, DVT).  Inflammation of a vein (phlebitis) that causes a blood clot to form.  Tumors in the pelvis that cause blood to back up.  What increases the risk? The following factors may make you more likely to develop this condition:  Having a family history of this condition.  Obesity.  Pregnancy.  Living without enough physical activity or exercise (sedentary lifestyle).  Smoking.  Having a job that requires long periods of  standing or sitting in one place.  Being a certain age. Women in their 79s and 20s and men in their 33s are more likely to develop this condition.  What are the signs or symptoms? Symptoms of this condition include:  Veins that are enlarged, bulging, or twisted (varicose veins).  Skin breakdown or ulcers.  Reddened or discolored skin on the front of the leg.  Brown, smooth, tight, and painful skin just above the ankle, usually on the inside of the leg (lipodermatosclerosis).  Swelling.  How is this diagnosed? This condition may be diagnosed based on:  Your medical history.  A physical exam.  Tests, such as: ? A procedure that creates an image of a blood vessel and nearby organs and provides information about blood flow through the blood vessel (duplex ultrasound). ? A procedure that tests blood flow (plethysmography). ? A procedure to look at the veins using X-ray and dye (venogram).  How is this treated? The goals of treatment are to help you return to an active life and to minimize pain or disability. Treatment depends on the severity of your condition, and it may include:  Wearing compression stockings. These can help relieve symptoms and help prevent your condition from getting worse. However, they do not cure the condition.  Sclerotherapy. This is a procedure involving an injection of a material that "dissolves" damaged veins.  Surgery. This may involve: ? Removing a diseased vein (vein stripping). ? Cutting off blood flow through the vein (laser ablation surgery). ? Repairing a valve.  Follow these instructions at home:  Wear compression stockings as told by your health care provider. These stockings help to prevent blood clots and reduce swelling in your legs.  Take over-the-counter and prescription medicines only as told by your health care provider.  Stay active by exercising, walking, or doing different activities. Ask your health care provider what activities  are safe for you and how much exercise you need.  Drink enough fluid to keep your urine clear or pale yellow.  Do not use any products that contain nicotine or tobacco, such as cigarettes and e-cigarettes. If you need help quitting, ask your health care provider.  Keep all follow-up visits as told by your health care provider. This is important. Contact a health care provider if:  You have redness, swelling, or more pain in the affected area.  You see a red streak or line that extends up or down from the affected area.  You have skin breakdown or a loss of skin in the affected area, even if the breakdown is small.  You get an injury in the affected area. Get help right away if:  You get an injury and an open wound in the affected area.  You have severe pain that does not get better with medicine.  You have sudden numbness or weakness in the foot or ankle below the affected area, or you have trouble moving your foot or ankle.  You have a fever and you have worse or persistent symptoms.  You have chest pain.  You have shortness of breath. Summary  Chronic venous insufficiency, also called venous stasis, is a condition that prevents blood from being pumped effectively through the veins in your legs.  Chronic venous insufficiency occurs when the vein walls become stretched, weakened, or damaged, or when valves within the vein are damaged.  Treatment for this condition depends on how severe your condition is, and it may involve wearing compression stockings or having a procedure.  Make sure you stay active by exercising, walking, or doing different activities. Ask your health care provider what activities are safe for you and how much exercise you need. This information is not intended to replace advice given to you by your health care provider. Make sure you discuss any questions you have with your health care provider. Document Released: 05/07/2006 Document Revised: 11/21/2015  Document Reviewed: 11/21/2015 Elsevier Interactive Patient Education  2017 [employer].

## 2017-07-04 NOTE — Progress Notes (Signed)
[facility] [place]   CC: Follow up venous stasis ulcer and peripheral artery occlusive disease  History of Present Illness Spencer Todd is a 84 y.o. male who returns for follow-up today to recheck a wound in his left leg.  He has been receiving Unna boot therapies for the last 3 weeks.  He has a mixed arterial and venous component.  He underwent angioplasty of the left superficial femoral popliteal and peroneal arteries by Dr. Donzetta Matters on Jun 05, 2017  to assist in healing these wounds.  The patient overall is very debilitated with fairly extensive metastatic prostate cancer.  He is quite frail.  He has had several falls recently.  He actually fell in our office 2 weeks ago and bumped his left shoulder.  He apparently had a fall recently that was significant enough that he was almost knocked unconscious and was evaluated at the [facility] emergency room.  He still has bilateral numbness and tingling in both upper extremities. Dr. Oneida Alar reviewed his CT scan of the cervical spine at pt visit on 06-27-17 which showed diffuse arthritic change and some narrowing of the central canal but no fracture obvious compromise.    He does not seem to walk enough to elicit claudication symptoms.   He has stage 4 prostate cancer.     Diabetic: No Tobacco use: non-smoker  Pt meds include: Statin :No Betablocker: No ASA: No Other anticoagulants/antiplatelets: no  Past Medical History:  Diagnosis Date  . Atherosclerotic vascular disease   . Varicose veins   . Venous insufficiency     Social History Social History   Tobacco Use  . Smoking status: Never Smoker  . Smokeless tobacco: Never Used  Substance Use Topics  . Alcohol use: No    Alcohol/week: 0.0 oz  . Drug use: No    Family History Family History  Problem Relation Age of Onset  . Hypertension Mother   . Heart disease Mother   . Heart disease Father        before age 42  . Heart attack Father   . Heart disease  Brother   . Hyperlipidemia Brother   . Heart attack Brother     Past Surgical History:  Procedure Laterality Date  . ABDOMINAL AORTOGRAM N/A 06/05/2017   Procedure: ABDOMINAL AORTOGRAM;  Surgeon: Waynetta Sandy, MD;  Location: [place] [facility];  Service: Cardiovascular;  Laterality: N/A;  . APPENDECTOMY  1976  . HERNIA REPAIR    . LOWER EXTREMITY ANGIOGRAPHY Bilateral 06/05/2017   Procedure: Lower Extremity Angiography;  Surgeon: Waynetta Sandy, MD;  Location: [place] [facility];  Service: Cardiovascular;  Laterality: Bilateral;  Bilateral   . PERIPHERAL VASCULAR BALLOON ANGIOPLASTY  06/05/2017   Procedure: PERIPHERAL VASCULAR BALLOON ANGIOPLASTY;  Surgeon: Waynetta Sandy, MD;  Location: [place] [facility];  Service: Cardiovascular;;  SFA and Peroneal    Allergies  Allergen Reactions  . Penicillins Hives, Other (See Comments) and Hypertension    Has patient had a PCN reaction causing immediate rash, facial/tongue/throat swelling, SOB or lightheadedness with hypotension: Yes Has patient had a PCN reaction causing severe rash involving mucus membranes or skin necrosis: No Has patient had a PCN reaction that required hospitalization: No Has patient had a PCN reaction occurring within the last 10 years: No If all of the above answers are "NO", then may proceed with Cephalosporin use.   . Tetracyclines & Related Hives  . Vancomycin Other (See Comments)    Upset stomach  Current Outpatient Medications  Medication Sig Dispense Refill  . Acetylcarnitine HCl (ACETYL L-CARNITINE PO) Take 1 capsule by mouth 2 (two) times daily.    . Acetylcysteine (NAC) 600 MG CAPS Take 600 mg by mouth 3 (three) times daily.    Marland Kitchen albuterol (PROVENTIL HFA;VENTOLIN HFA) 108 (90 Base) MCG/ACT inhaler Inhale 1 puff into the lungs every 6 (six) hours as needed for wheezing or shortness of breath.    . Amino Acids (L-CARNITINE PO) Take 1 capsule by mouth 3 (three) times daily.  UPLC Supplement    . b complex vitamins tablet Take 1 tablet by mouth 2 (two) times daily.    Marland Kitchen CALCIUM CITRATE PO Take 2 tablets by mouth 2 (two) times daily.    . Coenzyme Q10 (CO Q 10) 100 MG CAPS Take 100 mg by mouth 4 (four) times daily.    . Cranberry (CRAN-MAX PO) Take 1 capsule by mouth daily.    Marland Kitchen D-Ribose (RIBOSE, D,) POWD Take 1 Scoop by mouth 3 (three) times daily.    . enzalutamide (XTANDI) 40 MG capsule Take 4 capsules (160 mg total) by mouth daily. 120 capsule 0  . MAGNESIUM PO Take 1 tablet by mouth 3 (three) times daily.     . Misc Natural Products (TART CHERRY ADVANCED PO) Take 1 capsule by mouth 2 (two) times daily.    . Multiple Vitamins-Minerals (ANTIOXIDANT PO) Take 1 tablet by mouth 2 (two) times daily.    . Multiple Vitamins-Minerals (MULTIVITAMIN PO) Take 2 tablets by mouth daily.    . Nattokinase 100 MG CAPS Take 100 mg by mouth daily.     Marland Kitchen OVER THE COUNTER MEDICATION Take 1 capsule by mouth daily. Gamma E Supplement    . Pomegranate, Punica granatum, (POMEGRANATE EXTRACT PO) Take 1 capsule by mouth 2 (two) times daily.    . Probiotic Product (PROBIOTIC PO) Take 1 capsule by mouth daily.    . Sodium Chloride-Xylitol (XLEAR SINUS CARE SPRAY NA) Place 1-2 sprays into both nostrils 2 (two) times daily as needed (for congestion).    . TURMERIC CURCUMIN PO Take 1 capsule by mouth daily.    Marland Kitchen UBIQUINOL PO Take 1 capsule by mouth daily.    . vitamin C (ASCORBIC ACID) 500 MG tablet Take 1,000 mg by mouth 3 (three) times daily.     No current facility-administered medications for this visit.     ROS: See HPI for pertinent positives and negatives.   Physical Examination  Vitals:   07/04/17 1410 07/04/17 1411  BP: (!) 176/73 (!) 171/74  Pulse: 67   Resp: 20   SpO2: 97%   Weight: 174 lb (78.9 kg)   Height: 5\' 11"  (1.803 m)    Body mass index is 24.27 kg/m.  General: A&O x 3, WDWN, frail appearing elderly male. Gait: slow, deliberate, using cane HENT: No gross  abnormalities.  Eyes: PERRLA. Pulmonary: Respirations are non labored, CTAB, good air movement Cardiac: regular rhythm, no detected murmur.         Carotid Bruits Right Left   Negative Negative    Healed venous ulcer   Radial pulses are 2+ palpable bilaterally   Adominal aortic pulse is not palpable                           VASCULAR EXAM: Extremities without ischemic changes, without Gangrene; without open wounds. 1-2+ non pitting edema in both lower legs with venous stasis dermatitis. See above  photo.                                                                                                          LE Pulses Right Left       FEMORAL  2+ palpable  2+ palpable        POPLITEAL  not palpable   not palpable       POSTERIOR TIBIAL  not palpable   not palpable        DORSALIS PEDIS      ANTERIOR TIBIAL not palpable  not palpable    Abdomen: soft, NT, no palpable masses. Skin: no rashes, no cellulitis, no ulcers noted. Musculoskeletal: no muscle wasting or atrophy.  Neurologic: A&O X 3; appropriate affect, Sensation is normal; MOTOR FUNCTION:  moving all extremities equally, motor strength 5/5 throughout. Speech is fluent/normal. CN 2-12 intact. Psychiatric: Thought content is normal, mood appropriate for clinical situation.     ASSESSMENT: Cartel Mauss is a 84 y.o. male who presents with a healed venous stasis ulcer left lower leg after one week of unna boot compression dressing.   He is also s/p angioplasty of the left superficial femoral popliteal and peroneal arteries by Dr. Donzetta Matters on Jun 05, 2017  to assist in healing these wounds.    Dr. Oneida Alar indicated in his assessment on 06-27-17 that pt is not really a very good candidate for revascularization due to his overall frailty and progression of his cancer disease. Dr. Oneida Alar would consider a repeat arteriogram on him if things got worse but did not think he is a candidate for an open revascularization    I  reviewed with Dr. Oneida Alar pt venous duplex results from 06-27-17.   To address venous insufficiency edema and minimize risk of recurrence of venous stasis ulcer: elevation of feet above heart and knee high compression hose, he has the compression hose. I instructed pt and wife how to elevate feet and legs properly, see Patient Instructions.   To address arterial insufficiency in legs with pt hx of falling: daily seated leg exercises discussed with pt and wife and demonstrated.   DATA  Carotid Duplex 05-09-17: Right ICA: 60-79% stenosis Left ICA: 1-39% stenosis Right ECA: >50% stenosis Bilateral vertebral artery flow is antegrade.  Bilateral subclavian artery waveforms are normal.  No previous for comparison.    ABI (Date: 05-09-17):  R:   ABI: 0.71 (was 0.76 on 06-14-16),   PT: mono  DP: mono  TBI:  0.43, toe pressure 78 (was 0.36)  L:   ABI: 0.41 (was 0.38),   PT: mono  DP: mono  TBI: 0.20, toe pressure 39 (was 0.20)  Stable bilateral ABI with moderate disease in the right, severe disease in the left, all monophasic waveforms.    Aortogram 06-05-17: Findings: Aorta and iliac segments were heavily tortuous which make up and over access very difficult.  The SFA is occluded just after its takeoff and reconstitutes and above-knee popliteal artery.  He has single-vessel runoff on the left side via the peroneal artery.  On the  right side superficial femoral artery has multilevel stenoses but is in-line to the level of the popliteal and the TP trunk occludes the tibials are not definitively evaluated but appears to be peroneal possibly posterior tibial runoff on the right. We were able to use a reentry device and get through to the peroneal artery and balloon angioplasty this.  We also balloon angioplasty the popliteal and SFA arteries but given the tortuosity in the iliac arteries were unable to get any larger devices including stents down the superficial femoral artery.  We  subsequently lost wire access and try to get a stent to pass and could not regain wire access.  Given the patient's discomfort with the procedure as well as the length of time and contrast use we elected to stop the procedure Patient will need consideration of left femoral to popliteal artery bypass versus retrograde accesses of peroneal artery and balloon angioplasty and stenting of his SFA.   PLAN:  Based on the patient's vascular studies and examination, pt will return to clinic in 4 months with carotid duplex and ABI's.  Cancel unna boot follow ups since venous ulcer has healed.   I discussed in depth with the patient and his wife the nature of atherosclerosis, and emphasized the importance of maximal medical management including strict control of blood pressure, blood glucose, and lipid levels, obtaining regular exercise, and continued cessation of smoking.  The patient is aware that without maximal medical management the underlying atherosclerotic disease process will progress, limiting the benefit of any interventions.  The patient was given information about PAD including signs, symptoms, treatment, what symptoms should prompt the patient to seek immediate medical care, and risk reduction measures to take.  Clemon Chambers, RN, MSN, FNP-C [facility] Phone: [phone]  Clinic MD: Oneida Alar  07/04/17 2:39 PM

## 2017-07-10 ENCOUNTER — Telehealth: Payer: Self-pay | Admitting: Pharmacist

## 2017-07-10 NOTE — Telephone Encounter (Signed)
Oral Oncology Pharmacist Encounter  Received call from patient that the had just gotten off the phone with [facility] patient assistance pharmacy to schedule delivery of his first shipment of Xtandi through the manufacturer compassionate use program.  [name] should deliver directly to patient's home in 3-5 business days. Patient will start Xtandi the morning after he receives it.  Patient instructed to contact dispensing pharmacy 7 to 10 days prior to needing next Xtandi fill in order to prevent any delay in therapy.  We extensively discussed reenrollment process at the beginning of next calendar year and possible need for grant foundation assistance for copayments if mandated by manufacturer programs.  Patient expressed understanding and appreciation to the office for all involved in assistance application.  We extensively discussed patient's medication list and multiple supplements.  No drug interactions between current supplement list and Xtandi. Patient will make sure to screen any new medications for drug interactions prior to initiation.  Office visits for 07/31/2017 confirmed with patient and wife. They know to call the office in the meantime with any additional questions or concerns.  Johny Drilling, PharmD, BCPS, BCOP  07/10/2017 9:51 AM Oral Oncology Clinic [phone]

## 2017-07-11 ENCOUNTER — Encounter: Payer: Medicare Other | Admitting: Family

## 2017-07-15 ENCOUNTER — Telehealth: Payer: Self-pay | Admitting: Oncology

## 2017-07-15 NOTE — Telephone Encounter (Signed)
Faxed medical records to [facility] with [facility] @ [phone]. Release [id number]

## 2017-07-16 ENCOUNTER — Encounter: Payer: Medicare Other | Admitting: Family

## 2017-07-23 ENCOUNTER — Encounter: Payer: Medicare Other | Admitting: Family

## 2017-07-25 ENCOUNTER — Encounter: Payer: Medicare Other | Admitting: Family

## 2017-07-26 ENCOUNTER — Telehealth: Payer: Self-pay

## 2017-07-26 ENCOUNTER — Other Ambulatory Visit: Payer: Self-pay

## 2017-07-26 MED ORDER — ENZALUTAMIDE 40 MG PO CAPS: 160.0000 mg | ORAL_CAPSULE | Freq: Every day | ORAL | 0 refills | Status: DC

## 2017-07-26 NOTE — Telephone Encounter (Signed)
Pt noted slight dizziness and some gait issues. However, pt said that he believes it is about the same as before starting xtandi. Pt has been using a walker at home d/t PAD and 2 faals before the start of his xtandi. Encouraged pt to discuss with Dr. Alen Blew at visit next week. Pt verbalized agreement with plan, and thanks for the notification that his medication has been sent to [facility] for refill.

## 2017-07-30 ENCOUNTER — Encounter: Payer: Medicare Other | Admitting: Family

## 2017-07-31 ENCOUNTER — Inpatient Hospital Stay (HOSPITAL_BASED_OUTPATIENT_CLINIC_OR_DEPARTMENT_OTHER): Payer: Medicare Other | Admitting: Oncology

## 2017-07-31 ENCOUNTER — Telehealth: Payer: Self-pay | Admitting: Oncology

## 2017-07-31 ENCOUNTER — Inpatient Hospital Stay: Payer: Medicare Other | Attending: Oncology

## 2017-07-31 VITALS — BP 184/73 | HR 64 | Temp 97.9°F | Resp 18 | Ht 71.0 in | Wt 165.7 lb

## 2017-07-31 DIAGNOSIS — Z191 Hormone sensitive malignancy status: Secondary | ICD-10-CM | POA: Insufficient documentation

## 2017-07-31 DIAGNOSIS — C61 Malignant neoplasm of prostate: Secondary | ICD-10-CM

## 2017-07-31 DIAGNOSIS — M7989 Other specified soft tissue disorders: Secondary | ICD-10-CM

## 2017-07-31 DIAGNOSIS — Z79899 Other long term (current) drug therapy: Secondary | ICD-10-CM | POA: Diagnosis not present

## 2017-07-31 DIAGNOSIS — R531 Weakness: Secondary | ICD-10-CM

## 2017-07-31 DIAGNOSIS — R5383 Other fatigue: Secondary | ICD-10-CM

## 2017-07-31 DIAGNOSIS — I739 Peripheral vascular disease, unspecified: Secondary | ICD-10-CM | POA: Insufficient documentation

## 2017-07-31 DIAGNOSIS — N139 Obstructive and reflux uropathy, unspecified: Secondary | ICD-10-CM | POA: Insufficient documentation

## 2017-07-31 LAB — CMP (CANCER CENTER ONLY)
ALBUMIN: 3.7 g/dL (ref 3.5–5.0)
ALK PHOS: 363 U/L — AB (ref 38–126)
ALT: 19 U/L (ref 0–44)
AST: 19 U/L (ref 15–41)
Anion gap: 3 — ABNORMAL LOW (ref 5–15)
BUN: 27 mg/dL — AB (ref 8–23)
CHLORIDE: 107 mmol/L (ref 98–111)
CO2: 26 mmol/L (ref 22–32)
Calcium: 8.4 mg/dL — ABNORMAL LOW (ref 8.9–10.3)
Creatinine: 0.79 mg/dL (ref 0.61–1.24)
GFR, Estimated: >60 mL/min (ref >60)
GLUCOSE: 102 mg/dL — AB (ref 70–99)
Potassium: 4.8 mmol/L (ref 3.5–5.1)
SODIUM: 136 mmol/L (ref 135–145)
TOTAL PROTEIN: 6.1 g/dL — AB (ref 6.5–8.1)
Total Bilirubin: 0.4 mg/dL (ref 0.3–1.2)

## 2017-07-31 LAB — CBC WITH DIFFERENTIAL (CANCER CENTER ONLY)
BASOS ABS: 0 10*3/uL (ref 0.0–0.1)
BASOS PCT: 1 %
EOS ABS: 0 10*3/uL (ref 0.0–0.5)
EOS PCT: 1 %
HCT: 29 % — ABNORMAL LOW (ref 38.4–49.9)
HEMOGLOBIN: 9.5 g/dL — AB (ref 13.0–17.1)
Lymphocytes Relative: 17 %
Lymphs Abs: 0.8 10*3/uL — ABNORMAL LOW (ref 0.9–3.3)
MCH: 30.8 pg (ref 27.2–33.4)
MCHC: 32.8 g/dL (ref 32.0–36.0)
MCV: 93.9 fL (ref 79.3–98.0)
Monocytes Absolute: 0.4 10*3/uL (ref 0.1–0.9)
Monocytes Relative: 10 %
Neutro Abs: 3.2 10*3/uL (ref 1.5–6.5)
Neutrophils Relative %: 71 %
PLATELETS: 172 10*3/uL (ref 140–400)
RBC: 3.09 MIL/uL — AB (ref 4.20–5.82)
RDW: 18.1 % — ABNORMAL HIGH (ref 11.0–14.6)
WBC: 4.5 10*3/uL (ref 4.0–10.3)

## 2017-07-31 NOTE — Progress Notes (Signed)
Hematology and Oncology Follow Up Visit  Spencer Todd [id number] [birth year] 84 y.o. 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  His PSA is 2718 and a Gleason score 4+5 = 9.  He has disease to the bone.    Prior Therapy: He is status post prostate biopsy obtained on April 25, 2017 which showed Gleason score 4+5 = 9 and at least 5 cores performed by Dr. Gloriann Loan.   Current therapy:  Androgen deprivation under the care of Dr. Gloriann Loan he has received a [place].  Xtandi 160 mg daily started in June 2019.  Interim History: Spencer Todd presents today for a follow-up visit.  Since the last visit, he was started on Xtandi 160 mg daily and took it for the last 20 days.  He reported complaints of excessive fatigue and weakness associated with a an occasional dizziness.  He denies any falls or syncope but has reported the need to use a walker.  Have also noted his appetite has not improved and have lost more weight.  He has not reported any bone pain or pathological fractures.  He does report lower extremity edema which is chronic related to his peripheral arterial disease.  He does not report any headaches, blurry vision, syncope or seizures. Does not report any fevers, chills or sweats.  Does not report any cough, wheezing or hemoptysis.  Does not report any chest pain, palpitation, orthopnea or leg edema.  Does not report any nausea, vomiting or abdominal pain.  Does not report any constipation or diarrhea.  Does not report any skeletal complaints.    Does not report frequency, urgency or hematuria.  Does not report any skin rashes or lesions. Does not report any heat or cold intolerance.  Does not report any lymphadenopathy or petechiae.  Does not report any anxiety or depression.  Remaining review of systems is negative.    Medications: I have reviewed the patient's current medications.   Current Outpatient Medications  Medication Sig Dispense Refill  . Acetylcarnitine HCl (ACETYL L-CARNITINE PO) Take 1 capsule by mouth 2 (two) times daily.    . Acetylcysteine (NAC) 600 MG CAPS Take 600 mg by mouth 3 (three) times daily.    Marland Kitchen albuterol (PROVENTIL HFA;VENTOLIN HFA) 108 (90 Base) MCG/ACT inhaler Inhale 1 puff into the lungs every 6 (six) hours as needed for wheezing or shortness of breath.    . Amino Acids (L-CARNITINE PO) Take 1 capsule by mouth 3 (three) times daily. UPLC Supplement    . b complex vitamins tablet Take 1 tablet by mouth 2 (two) times daily.    Marland Kitchen CALCIUM CITRATE PO Take 2 tablets by mouth 2 (two) times daily.    . Coenzyme Q10 (CO Q 10) 100 MG CAPS Take 100 mg by mouth 4 (four) times daily.    . Cranberry (CRAN-MAX PO) Take 1 capsule by mouth daily.    Marland Kitchen D-Ribose (RIBOSE, D,) POWD Take 1 Scoop by mouth 3 (three) times daily.    . enzalutamide (XTANDI) 40 MG capsule Take 4 capsules (160 mg total) by mouth daily. 120 capsule 0  . MAGNESIUM PO Take 1 tablet by mouth 3 (three) times daily.     . Misc Natural Products (TART CHERRY ADVANCED PO) Take 1 capsule by mouth 2 (two) times daily.    . Multiple Vitamins-Minerals (ANTIOXIDANT PO) Take 1 tablet by mouth 2 (two) times daily.    . Multiple  Vitamins-Minerals (MULTIVITAMIN PO) Take 2 tablets by mouth daily.    . Nattokinase 100 MG CAPS Take 100 mg by mouth daily.     Marland Kitchen OVER THE COUNTER MEDICATION Take 1 capsule by mouth daily. Gamma E Supplement    . Pomegranate, Punica granatum, (POMEGRANATE EXTRACT PO) Take 1 capsule by mouth 2 (two) times daily.    . Probiotic Product (PROBIOTIC PO) Take 1 capsule by mouth daily.    . Sodium Chloride-Xylitol (XLEAR SINUS CARE SPRAY NA) Place 1-2 sprays into both nostrils 2 (two) times daily as needed (for congestion).    . TURMERIC CURCUMIN PO Take 1 capsule by mouth daily.    Marland Kitchen UBIQUINOL PO Take 1 capsule by mouth daily.    . vitamin C (ASCORBIC ACID) 500 MG tablet Take 1,000  mg by mouth 3 (three) times daily.     No current facility-administered medications for this visit.      Allergies:  Allergies  Allergen Reactions  . Penicillins Hives, Other (See Comments) and Hypertension    Has patient had a PCN reaction causing immediate rash, facial/tongue/throat swelling, SOB or lightheadedness with hypotension: Yes Has patient had a PCN reaction causing severe rash involving mucus membranes or skin necrosis: No Has patient had a PCN reaction that required hospitalization: No Has patient had a PCN reaction occurring within the last 10 years: No If all of the above answers are "NO", then may proceed with Cephalosporin use.   . Tetracyclines & Related Hives  . Vancomycin Other (See Comments)    Upset stomach    Past Medical History, Surgical history, Social history, and Family History were reviewed and updated.   Physical Exam: Blood pressure (!) 184/73, pulse 64, temperature 97.9 F (36.6 C), temperature source Oral, resp. rate 18, height 5\' 11"  (1.803 m), weight 165 lb 11.2 oz (75.2 kg), SpO2 100 %.   ECOG: 1 General appearance: alert and cooperative appeared without distress. Head: Normocephalic, without obvious abnormality Oropharynx: No oral thrush or ulcers. Eyes: No scleral icterus.  Pupils are equal and round reactive to light. Lymph nodes: Cervical, supraclavicular, and axillary nodes normal. Heart:regular rate and rhythm, S1, S2 normal, no murmur, click, rub or gallop Lung:chest clear, no wheezing, rales, normal symmetric air entry Abdomin: soft, non-tender, without masses or organomegaly. Neurological: No motor, sensory deficits.  Intact deep tendon reflexes. Skin: No rashes or lesions.  No ecchymosis or petechiae. Musculoskeletal: No joint deformity or effusion. Psychiatric: Mood and affect are appropriate.    Lab Results: Lab Results  Component Value Date   HGB 7.1 (L) 06/05/2017   HCT 21.0 (L) 06/05/2017     Chemistry       Component Value Date/Time   NA 138 06/05/2017 1019   K 4.5 06/05/2017 1019   CL 106 06/05/2017 1019   BUN 24 (H) 06/05/2017 1019   CREATININE 0.80 06/05/2017 1019   No results found for: CALCIUM, ALKPHOS, AST, ALT, BILITOT      Impression and Plan:  84 year old man with the following:  1.  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  He presented with a Gleason score 4+5 = 9 and a PSA of 2718.  He was started on Xtandi for the last 20 days and tolerated the full dose poorly.  He had a complaints of anorexia and excessive fatigue and has been using a walker which she has not used previously.  Risks and benefits of continuing this medication and reduced dose was reviewed  today and he is willing to try it.  The plan is to proceed with a dose reduction to 80 mg daily and assess him in 4 weeks.  Further dose reductions or discontinuation may be needed if he does not tolerate it at a lower dose.  2.  Androgen deprivation: He is currently receiving Lupron every 4 to 6 months.  He will continue this under the care of Dr. Gloriann Loan indefinitely.  Risks and benefits of this approach was reviewed as well as the rationale for using long-term androgen deprivation.  3.  Bone directed therapy: He was started on Xgeva with calcium supplements.  Rationale for using this medication was discussed and is agreeable to continue it under the care of Dr. Gloriann Loan.  4.  Obstructive uropathy: Managed by Dr. Gloriann Loan and creatinine on Jun 05, 2017 was normal.  5.  Goals of care: Treatment is palliative at this time although his performance status is adequate and aggressive therapy is warranted.  6.  Follow-up: We will be in 4 weeks to follow his progress.  25  minutes was spent with the patient face-to-face today.  More than 50% of time was dedicated to patient counseling, education and discussing the natural course of this disease and future treatment options.   Zola Button,  MD 7/17/20191:36 PM

## 2017-07-31 NOTE — Telephone Encounter (Signed)
Scheduled appt per 7/17 los - gave patient aVS and calender per los. -  

## 2017-08-01 ENCOUNTER — Telehealth: Payer: Self-pay | Admitting: *Deleted

## 2017-08-01 LAB — PROSTATE-SPECIFIC AG, SERUM (LABCORP): PROSTATE SPECIFIC AG, SERUM: 25 ng/mL — AB (ref 0.0–4.0)

## 2017-08-01 NOTE — Telephone Encounter (Signed)
Spoke with wife, gave results of last PSA 

## 2017-08-01 NOTE — Telephone Encounter (Signed)
-----   Message from Wyatt Portela, MD sent at 08/01/2017  7:52 AM EDT ----- Please let him know his PSA

## 2017-08-02 ENCOUNTER — Telehealth: Payer: Self-pay | Admitting: *Deleted

## 2017-08-02 NOTE — Telephone Encounter (Signed)
Wife dorenda calling to say patient has been extremely weak since he saw dr Alen Blew on Tuesday. Has constant headaches, decreased alertness, confusion and unable to walk on right leg, he just drags it. States he has been on half a dose of xtandi since 07/30/17

## 2017-08-02 NOTE — Telephone Encounter (Signed)
He needs to go to the ED.

## 2017-08-02 NOTE — Telephone Encounter (Signed)
Spoke with wife. Per dr Alen Blew, patient should report to the E.D. Wife verbalized understanding.

## 2017-08-04 ENCOUNTER — Inpatient Hospital Stay (HOSPITAL_COMMUNITY)
Admission: EM | Disposition: A | Discharge status: Home Health | Payer: Self-pay | Source: Home / Self Care | Attending: Neurosurgery

## 2017-08-04 ENCOUNTER — Inpatient Hospital Stay (HOSPITAL_COMMUNITY)
Admission: EM | Admit: 2017-08-04 | Discharge: 2017-08-08 | DRG: 027 | Disposition: A | Discharge status: Home Health | Payer: Medicare Other | Attending: Neurosurgery | Admitting: Neurosurgery

## 2017-08-04 ENCOUNTER — Emergency Department (HOSPITAL_COMMUNITY): Payer: Medicare Other | Admitting: Anesthesiology

## 2017-08-04 ENCOUNTER — Other Ambulatory Visit: Payer: Self-pay

## 2017-08-04 ENCOUNTER — Emergency Department (HOSPITAL_COMMUNITY): Payer: Medicare Other

## 2017-08-04 ENCOUNTER — Encounter (HOSPITAL_COMMUNITY): Payer: Self-pay

## 2017-08-04 DIAGNOSIS — S065X9A Traumatic subdural hemorrhage with loss of consciousness of unspecified duration, initial encounter: Secondary | ICD-10-CM | POA: Diagnosis present

## 2017-08-04 DIAGNOSIS — Z880 Allergy status to penicillin: Secondary | ICD-10-CM | POA: Diagnosis not present

## 2017-08-04 DIAGNOSIS — I739 Peripheral vascular disease, unspecified: Secondary | ICD-10-CM | POA: Diagnosis present

## 2017-08-04 DIAGNOSIS — D649 Anemia, unspecified: Secondary | ICD-10-CM | POA: Diagnosis present

## 2017-08-04 DIAGNOSIS — Z8546 Personal history of malignant neoplasm of prostate: Secondary | ICD-10-CM | POA: Diagnosis not present

## 2017-08-04 DIAGNOSIS — R296 Repeated falls: Secondary | ICD-10-CM | POA: Diagnosis present

## 2017-08-04 DIAGNOSIS — Z9889 Other specified postprocedural states: Secondary | ICD-10-CM

## 2017-08-04 DIAGNOSIS — S065XAA Traumatic subdural hemorrhage with loss of consciousness status unknown, initial encounter: Secondary | ICD-10-CM

## 2017-08-04 DIAGNOSIS — X58XXXA Exposure to other specified factors, initial encounter: Secondary | ICD-10-CM | POA: Diagnosis present

## 2017-08-04 DIAGNOSIS — Z888 Allergy status to other drugs, medicaments and biological substances status: Secondary | ICD-10-CM

## 2017-08-04 HISTORY — PX: CRANIOTOMY: SHX93

## 2017-08-04 LAB — COMPREHENSIVE METABOLIC PANEL
ALBUMIN: 3.9 g/dL (ref 3.5–5.0)
ALK PHOS: 273 U/L — AB (ref 38–126)
ALT: 16 U/L (ref 0–44)
ANION GAP: 5 (ref 5–15)
AST: 23 U/L (ref 15–41)
BILIRUBIN TOTAL: 0.8 mg/dL (ref 0.3–1.2)
BUN: 21 mg/dL (ref 8–23)
CALCIUM: 7.6 mg/dL — AB (ref 8.9–10.3)
CO2: 26 mmol/L (ref 22–32)
CREATININE: 0.69 mg/dL (ref 0.61–1.24)
Chloride: 109 mmol/L (ref 98–111)
GFR calc non Af Amer: >60 mL/min (ref >60)
GLUCOSE: 113 mg/dL — AB (ref 70–99)
Potassium: 4.4 mmol/L (ref 3.5–5.1)
SODIUM: 140 mmol/L (ref 135–145)
TOTAL PROTEIN: 6.7 g/dL (ref 6.5–8.1)

## 2017-08-04 LAB — CBC WITH DIFFERENTIAL/PLATELET
Basophils Absolute: 0 10*3/uL (ref 0.0–0.1)
Basophils Relative: 0 %
EOS ABS: 0 10*3/uL (ref 0.0–0.7)
Eosinophils Relative: 1 %
HEMATOCRIT: 36.2 % — AB (ref 39.0–52.0)
HEMOGLOBIN: 11.4 g/dL — AB (ref 13.0–17.0)
LYMPHS ABS: 0.7 10*3/uL (ref 0.7–4.0)
Lymphocytes Relative: 13 %
MCH: 30.2 pg (ref 26.0–34.0)
MCHC: 31.5 g/dL (ref 30.0–36.0)
MCV: 95.8 fL (ref 78.0–100.0)
MONOS PCT: 9 %
Monocytes Absolute: 0.5 10*3/uL (ref 0.1–1.0)
NEUTROS ABS: 4.1 10*3/uL (ref 1.7–7.7)
NEUTROS PCT: 77 %
Platelets: 234 10*3/uL (ref 150–400)
RBC: 3.78 MIL/uL — AB (ref 4.22–5.81)
RDW: 16.5 % — ABNORMAL HIGH (ref 11.5–15.5)
WBC: 5.3 10*3/uL (ref 4.0–10.5)

## 2017-08-04 LAB — URINALYSIS, ROUTINE W REFLEX MICROSCOPIC
BACTERIA UA: NONE SEEN
Bilirubin Urine: NEGATIVE
GLUCOSE, UA: NEGATIVE mg/dL
Ketones, ur: NEGATIVE mg/dL
NITRITE: NEGATIVE
PH: 7 (ref 5.0–8.0)
Protein, ur: NEGATIVE mg/dL
SPECIFIC GRAVITY, URINE: 1.008 (ref 1.005–1.030)

## 2017-08-04 LAB — I-STAT CG4 LACTIC ACID, ED: Lactic Acid, Venous: 0.65 mmol/L (ref 0.5–1.9)

## 2017-08-04 LAB — CBG MONITORING, ED: GLUCOSE-CAPILLARY: 95 mg/dL (ref 70–99)

## 2017-08-04 LAB — MRSA PCR SCREENING: MRSA BY PCR: NEGATIVE

## 2017-08-04 SURGERY — CRANIOTOMY HEMATOMA EVACUATION SUBDURAL
Anesthesia: General | Site: Head | Laterality: Left

## 2017-08-04 MED ORDER — LEVETIRACETAM IN NACL 500 MG/100ML IV SOLN
500.0000 mg | Freq: Two times a day (BID) | INTRAVENOUS | Status: DC
  Administered 2017-08-04 – 2017-08-07 (×7): 500 mg via INTRAVENOUS
  Filled 2017-08-04 (×7): qty 100

## 2017-08-04 MED ORDER — ACETAMINOPHEN 325 MG PO TABS
650.0000 mg | ORAL_TABLET | ORAL | Status: DC | PRN
  Administered 2017-08-05: 325 mg via ORAL
  Administered 2017-08-05 – 2017-08-07 (×3): 650 mg via ORAL
  Filled 2017-08-04 (×4): qty 2

## 2017-08-04 MED ORDER — LIDOCAINE HCL URETHRAL/MUCOSAL 2 % EX GEL
1.0000 "application " | Freq: Once | CUTANEOUS | Status: AC
  Administered 2017-08-04: 11:00:00 via URETHRAL
  Filled 2017-08-04: qty 5

## 2017-08-04 MED ORDER — THROMBIN 20000 UNITS EX SOLR
CUTANEOUS | Status: DC | PRN
  Administered 2017-08-04: 20 mL via TOPICAL

## 2017-08-04 MED ORDER — ACETAMINOPHEN 650 MG RE SUPP: 650.0000 mg | RECTAL | Status: DC | PRN

## 2017-08-04 MED ORDER — CEFAZOLIN SODIUM-DEXTROSE 2-4 GM/100ML-% IV SOLN
2.0000 g | Freq: Three times a day (TID) | INTRAVENOUS | Status: AC
  Administered 2017-08-04 – 2017-08-05 (×2): 2 g via INTRAVENOUS
  Filled 2017-08-04 (×2): qty 100

## 2017-08-04 MED ORDER — HYDROMORPHONE HCL 1 MG/ML IJ SOLN
0.5000 mg | INTRAMUSCULAR | Status: DC | PRN
  Administered 2017-08-05: 1 mg via INTRAVENOUS
  Filled 2017-08-04: qty 1

## 2017-08-04 MED ORDER — 0.9 % SODIUM CHLORIDE (POUR BTL) OPTIME
TOPICAL | Status: DC | PRN
  Administered 2017-08-04: 1000 mL

## 2017-08-04 MED ORDER — SUGAMMADEX SODIUM 200 MG/2ML IV SOLN
INTRAVENOUS | Status: DC | PRN
  Administered 2017-08-04: 300 mg via INTRAVENOUS

## 2017-08-04 MED ORDER — DOCUSATE SODIUM 100 MG PO CAPS
100.0000 mg | ORAL_CAPSULE | Freq: Two times a day (BID) | ORAL | Status: DC
  Administered 2017-08-05 – 2017-08-08 (×6): 100 mg via ORAL
  Filled 2017-08-04 (×8): qty 1

## 2017-08-04 MED ORDER — DEXAMETHASONE SODIUM PHOSPHATE 10 MG/ML IJ SOLN
INTRAMUSCULAR | Status: DC | PRN
  Administered 2017-08-04: 10 mg via INTRAVENOUS

## 2017-08-04 MED ORDER — ACETYL L-CARNITINE 250 MG PO CAPS: ORAL_CAPSULE | Freq: Two times a day (BID) | ORAL | Status: DC

## 2017-08-04 MED ORDER — OXYCODONE HCL 5 MG PO TABS: 5.0000 mg | ORAL_TABLET | Freq: Once | ORAL | Status: DC | PRN

## 2017-08-04 MED ORDER — FENTANYL CITRATE (PF) 100 MCG/2ML IJ SOLN
25.0000 ug | INTRAMUSCULAR | Status: DC | PRN
  Administered 2017-08-04: 25 ug via INTRAVENOUS

## 2017-08-04 MED ORDER — HYDROCODONE-ACETAMINOPHEN 5-325 MG PO TABS
1.0000 | ORAL_TABLET | ORAL | Status: DC | PRN
  Administered 2017-08-05: 1 via ORAL
  Filled 2017-08-04: qty 1

## 2017-08-04 MED ORDER — ACETYLCYSTEINE 600 MG PO CAPS: 600.0000 mg | ORAL_CAPSULE | Freq: Three times a day (TID) | ORAL | Status: DC

## 2017-08-04 MED ORDER — CALCIUM CITRATE 950 (200 CA) MG PO TABS
200.0000 mg | ORAL_TABLET | Freq: Two times a day (BID) | ORAL | Status: DC
  Administered 2017-08-04 – 2017-08-08 (×8): 200 mg via ORAL
  Filled 2017-08-04 (×8): qty 1

## 2017-08-04 MED ORDER — FENTANYL CITRATE (PF) 100 MCG/2ML IJ SOLN
INTRAMUSCULAR | Status: DC | PRN
  Administered 2017-08-04: 150 ug via INTRAVENOUS

## 2017-08-04 MED ORDER — EPHEDRINE SULFATE 50 MG/ML IJ SOLN
INTRAMUSCULAR | Status: DC | PRN
  Administered 2017-08-04: 10 mg via INTRAVENOUS

## 2017-08-04 MED ORDER — SODIUM CHLORIDE 0.9 % IV SOLN
INTRAVENOUS | Status: DC | PRN
  Administered 2017-08-04: 50 ug/min via INTRAVENOUS

## 2017-08-04 MED ORDER — ONDANSETRON HCL 4 MG PO TABS: 4.0000 mg | ORAL_TABLET | ORAL | Status: DC | PRN

## 2017-08-04 MED ORDER — ROCURONIUM BROMIDE 100 MG/10ML IV SOLN
INTRAVENOUS | Status: DC | PRN
  Administered 2017-08-04: 40 mg via INTRAVENOUS

## 2017-08-04 MED ORDER — OXYCODONE HCL 5 MG/5ML PO SOLN: 5.0000 mg | Freq: Once | ORAL | Status: DC | PRN

## 2017-08-04 MED ORDER — CEFAZOLIN SODIUM-DEXTROSE 2-3 GM-%(50ML) IV SOLR
INTRAVENOUS | Status: DC | PRN
  Administered 2017-08-04: 2 g via INTRAVENOUS

## 2017-08-04 MED ORDER — ONDANSETRON HCL 4 MG/2ML IJ SOLN: 4.0000 mg | Freq: Four times a day (QID) | INTRAMUSCULAR | Status: DC | PRN

## 2017-08-04 MED ORDER — FAMOTIDINE IN NACL 20-0.9 MG/50ML-% IV SOLN
20.0000 mg | Freq: Two times a day (BID) | INTRAVENOUS | Status: DC
  Administered 2017-08-04 – 2017-08-06 (×5): 20 mg via INTRAVENOUS
  Filled 2017-08-04 (×5): qty 50

## 2017-08-04 MED ORDER — PROMETHAZINE HCL 25 MG PO TABS: 12.5000 mg | ORAL_TABLET | ORAL | Status: DC | PRN

## 2017-08-04 MED ORDER — SODIUM CHLORIDE 0.9 % IV SOLN
INTRAVENOUS | Status: DC | PRN
  Administered 2017-08-04: 500 mL

## 2017-08-04 MED ORDER — POLYETHYLENE GLYCOL 3350 17 G PO PACK: 17.0000 g | PACK | Freq: Every day | ORAL | Status: DC | PRN

## 2017-08-04 MED ORDER — NALOXONE HCL 0.4 MG/ML IJ SOLN: 0.0800 mg | INTRAMUSCULAR | Status: DC | PRN

## 2017-08-04 MED ORDER — SODIUM CHLORIDE 0.9 % IV SOLN
INTRAVENOUS | Status: DC
  Administered 2017-08-04: 12:00:00 via INTRAVENOUS

## 2017-08-04 MED ORDER — ONDANSETRON HCL 4 MG/2ML IJ SOLN
INTRAMUSCULAR | Status: DC | PRN
  Administered 2017-08-04: 4 mg via INTRAVENOUS

## 2017-08-04 MED ORDER — FENTANYL CITRATE (PF) 100 MCG/2ML IJ SOLN
INTRAMUSCULAR | Status: AC
  Administered 2017-08-04: 25 ug via INTRAVENOUS
  Filled 2017-08-04: qty 2

## 2017-08-04 MED ORDER — LIDOCAINE HCL (CARDIAC) PF 100 MG/5ML IV SOSY
PREFILLED_SYRINGE | INTRAVENOUS | Status: DC | PRN
  Administered 2017-08-04: 60 mg via INTRAVENOUS

## 2017-08-04 MED ORDER — CRANBERRY 500 MG PO CAPS: ORAL_CAPSULE | Freq: Every day | ORAL | Status: DC

## 2017-08-04 MED ORDER — LIDOCAINE-EPINEPHRINE 1 %-1:100000 IJ SOLN
INTRAMUSCULAR | Status: DC | PRN
  Administered 2017-08-04: 10 mL via INTRADERMAL

## 2017-08-04 MED ORDER — GLYCOPYRROLATE 0.2 MG/ML IJ SOLN
INTRAMUSCULAR | Status: DC | PRN
  Administered 2017-08-04: 0.2 mg via INTRAVENOUS

## 2017-08-04 MED ORDER — SODIUM CHLORIDE 0.9 % IV SOLN
INTRAVENOUS | Status: DC
  Administered 2017-08-04: 19:00:00 via INTRAVENOUS

## 2017-08-04 MED ORDER — PROPOFOL 10 MG/ML IV BOLUS
INTRAVENOUS | Status: DC | PRN
  Administered 2017-08-04: 100 mg via INTRAVENOUS

## 2017-08-04 MED ORDER — SODIUM CHLORIDE 0.9 % IV BOLUS
500.0000 mL | Freq: Once | INTRAVENOUS | Status: AC
  Administered 2017-08-04: 500 mL via INTRAVENOUS

## 2017-08-04 MED ORDER — ENZALUTAMIDE 40 MG PO CAPS
160.0000 mg | ORAL_CAPSULE | Freq: Every day | ORAL | Status: DC
  Filled 2017-08-04: qty 4

## 2017-08-04 MED ORDER — ENZALUTAMIDE 40 MG PO CAPS
80.0000 mg | ORAL_CAPSULE | Freq: Every day | ORAL | Status: DC
  Administered 2017-08-04 – 2017-08-08 (×5): 80 mg via ORAL
  Filled 2017-08-04 (×5): qty 2

## 2017-08-04 MED ORDER — ONDANSETRON HCL 4 MG/2ML IJ SOLN: 4.0000 mg | INTRAMUSCULAR | Status: DC | PRN

## 2017-08-04 MED ORDER — ALBUTEROL SULFATE (2.5 MG/3ML) 0.083% IN NEBU: 2.5000 mg | INHALATION_SOLUTION | Freq: Four times a day (QID) | RESPIRATORY_TRACT | Status: DC | PRN

## 2017-08-04 MED ORDER — POTASSIUM CHLORIDE IN NACL 20-0.9 MEQ/L-% IV SOLN
INTRAVENOUS | Status: DC
  Administered 2017-08-04 – 2017-08-07 (×6): via INTRAVENOUS
  Filled 2017-08-04 (×7): qty 1000

## 2017-08-04 MED ORDER — MAGNESIUM 200 MG PO TABS: ORAL_TABLET | Freq: Three times a day (TID) | ORAL | Status: DC

## 2017-08-04 MED ORDER — LABETALOL HCL 5 MG/ML IV SOLN
10.0000 mg | INTRAVENOUS | Status: DC | PRN
  Administered 2017-08-05: 20 mg via INTRAVENOUS
  Administered 2017-08-05: 10 mg via INTRAVENOUS
  Administered 2017-08-05: 20 mg via INTRAVENOUS
  Administered 2017-08-05 (×4): 10 mg via INTRAVENOUS
  Administered 2017-08-07: 20 mg via INTRAVENOUS
  Filled 2017-08-04 (×5): qty 4
  Filled 2017-08-04 (×2): qty 8

## 2017-08-04 MED ORDER — CO Q 10 100 MG PO CAPS: 100.0000 mg | ORAL_CAPSULE | Freq: Four times a day (QID) | ORAL | Status: DC

## 2017-08-04 SURGICAL SUPPLY — 67 items
BAG DECANTER FOR FLEXI CONT (MISCELLANEOUS) ×3 IMPLANT
BANDAGE GAUZE 4  KLING STR (GAUZE/BANDAGES/DRESSINGS) IMPLANT
BIT DRILL WIRE PASS 1.3MM (BIT) ×1 IMPLANT
BLADE SURG 11 STRL SS (BLADE) IMPLANT
BNDG COHESIVE 4X5 TAN NS LF (GAUZE/BANDAGES/DRESSINGS) IMPLANT
BUR ACORN 6.0 PRECISION (BURR) ×2 IMPLANT
BUR ACORN 6.0MM PRECISION (BURR) ×1
BUR SPIRAL ROUTER 2.3 (BUR) IMPLANT
BUR SPIRAL ROUTER 2.3MM (BUR)
CANISTER SUCT 3000ML PPV (MISCELLANEOUS) ×3 IMPLANT
CARTRIDGE OIL MAESTRO DRILL (MISCELLANEOUS) ×1 IMPLANT
CLIP VESOCCLUDE MED 6/CT (CLIP) IMPLANT
COVER BACK TABLE 60X90IN (DRAPES) ×6 IMPLANT
DERMABOND ADVANCED (GAUZE/BANDAGES/DRESSINGS)
DERMABOND ADVANCED .7 DNX12 (GAUZE/BANDAGES/DRESSINGS) IMPLANT
DIFFUSER DRILL AIR PNEUMATIC (MISCELLANEOUS) ×3 IMPLANT
DRAIN CHANNEL 10M FLAT 3/4 FLT (DRAIN) ×3 IMPLANT
DRAPE MICROSCOPE LEICA (MISCELLANEOUS) IMPLANT
DRAPE NEUROLOGICAL W/INCISE (DRAPES) ×3 IMPLANT
DRAPE SURG 17X23 STRL (DRAPES) IMPLANT
DRAPE WARM FLUID 44X44 (DRAPE) ×3 IMPLANT
DRILL WIRE PASS 1.3MM (BIT) ×3
DRSG OPSITE POSTOP 3X4 (GAUZE/BANDAGES/DRESSINGS) ×6 IMPLANT
ELECT CAUTERY BLADE 6.4 (BLADE) ×3 IMPLANT
ELECT REM PT RETURN 9FT ADLT (ELECTROSURGICAL) ×3
ELECTRODE REM PT RTRN 9FT ADLT (ELECTROSURGICAL) ×1 IMPLANT
EVACUATOR SILICONE 100CC (DRAIN) IMPLANT
GAUZE SPONGE 4X4 12PLY STRL (GAUZE/BANDAGES/DRESSINGS) ×3 IMPLANT
GAUZE SPONGE 4X4 16PLY XRAY LF (GAUZE/BANDAGES/DRESSINGS) IMPLANT
GLOVE BIO SURGEON STRL SZ 6.5 (GLOVE) ×2 IMPLANT
GLOVE BIO SURGEON STRL SZ7 (GLOVE) ×6 IMPLANT
GLOVE BIO SURGEONS STRL SZ 6.5 (GLOVE) ×1
GLOVE ECLIPSE 9.0 STRL (GLOVE) ×3 IMPLANT
GLOVE EXAM NITRILE LRG STRL (GLOVE) IMPLANT
GLOVE EXAM NITRILE XL STR (GLOVE) IMPLANT
GLOVE EXAM NITRILE XS STR PU (GLOVE) IMPLANT
GOWN STRL REUS W/ TWL LRG LVL3 (GOWN DISPOSABLE) IMPLANT
GOWN STRL REUS W/ TWL XL LVL3 (GOWN DISPOSABLE) IMPLANT
GOWN STRL REUS W/TWL 2XL LVL3 (GOWN DISPOSABLE) IMPLANT
GOWN STRL REUS W/TWL LRG LVL3 (GOWN DISPOSABLE)
GOWN STRL REUS W/TWL XL LVL3 (GOWN DISPOSABLE)
HEMOSTAT SURGICEL 2X14 (HEMOSTASIS) IMPLANT
KIT BASIN OR (CUSTOM PROCEDURE TRAY) ×3 IMPLANT
KIT TURNOVER KIT B (KITS) ×3 IMPLANT
NEEDLE HYPO 25X1 1.5 SAFETY (NEEDLE) ×3 IMPLANT
NS IRRIG 1000ML POUR BTL (IV SOLUTION) ×3 IMPLANT
OIL CARTRIDGE MAESTRO DRILL (MISCELLANEOUS) ×3
PACK CRANIOTOMY CUSTOM (CUSTOM PROCEDURE TRAY) ×3 IMPLANT
PAD ARMBOARD 7.5X6 YLW CONV (MISCELLANEOUS) ×3 IMPLANT
PATTIES SURGICAL .25X.25 (GAUZE/BANDAGES/DRESSINGS) IMPLANT
PATTIES SURGICAL .5 X.5 (GAUZE/BANDAGES/DRESSINGS) IMPLANT
PATTIES SURGICAL .5 X3 (DISPOSABLE) IMPLANT
PATTIES SURGICAL 1X1 (DISPOSABLE) IMPLANT
PIN MAYFIELD SKULL DISP (PIN) IMPLANT
SPECIMEN JAR SMALL (MISCELLANEOUS) IMPLANT
SPONGE LAP 18X18 X RAY DECT (DISPOSABLE) IMPLANT
SPONGE NEURO XRAY DETECT 1X3 (DISPOSABLE) IMPLANT
SPONGE SURGIFOAM ABS GEL 100 (HEMOSTASIS) ×3 IMPLANT
STAPLER VISISTAT 35W (STAPLE) ×3 IMPLANT
SUT NURALON 4 0 TR CR/8 (SUTURE) IMPLANT
SUT VIC AB 2-0 CT2 18 VCP726D (SUTURE) ×6 IMPLANT
SYR CONTROL 10ML LL (SYRINGE) ×3 IMPLANT
TOWEL GREEN STERILE (TOWEL DISPOSABLE) ×3 IMPLANT
TOWEL GREEN STERILE FF (TOWEL DISPOSABLE) ×3 IMPLANT
TRAY FOLEY MTR SLVR 16FR STAT (SET/KITS/TRAYS/PACK) IMPLANT
UNDERPAD 30X30 (UNDERPADS AND DIAPERS) IMPLANT
WATER STERILE IRR 1000ML POUR (IV SOLUTION) ×3 IMPLANT

## 2017-08-04 NOTE — ED Triage Notes (Signed)
He is alert ad oriented x 4 with clear speech. He and his wife tell us that, since yesterday, pt. Has "been real weak, on both sides, but more on the right than the left". They were advised by his pcp to come here. They also state pt. Is on oral prostate cancer therapy.

## 2017-08-04 NOTE — Brief Op Note (Addendum)
08/04/2017  1:24 PM  PATIENT:  Spencer Todd  84 y.o. male  PRE-OPERATIVE DIAGNOSIS:  LEFT SIDE SUBDURAL HEMATOMA  POST-OPERATIVE DIAGNOSIS:  LEFT SIDE SUBDURAL HEMATOMA  PROCEDURE:  Procedure(s): LEFT SIDED BURR HOLES FOR HEMATOMA EVACUATION SUBDURAL (Left)  SURGEON:  Surgeon(s) and Role:    * Earnie Larsson, MD - Primary  PHYSICIAN ASSISTANT:   ASSISTANTS:    ANESTHESIA:   general  EBL:  125 mL   BLOOD ADMINISTERED:none  DRAINS: none and (10 mm) Blake drain(s) in the Subdural space   LOCAL MEDICATIONS USED:  XYLOCAINE   SPECIMEN:  No Specimen  DISPOSITION OF SPECIMEN:  N/A  COUNTS:  YES  TOURNIQUET:  * No tourniquets in log *  DICTATION: .Dragon Dictation  PLAN OF CARE: Admit to inpatient   PATIENT DISPOSITION:  PACU - hemodynamically stable.   Delay start of Pharmacological VTE agent (>24hrs) due to surgical blood loss or risk of bleeding: yes

## 2017-08-04 NOTE — Progress Notes (Signed)
Per pt and family, pt's primary oncologist reduced his oral chemotherapy, Xtandi, dose in half from 160mg  to 80mg  on 7/16 d/t weakness and dizziness. Spoke with Dr. Annette Stable who said it is okay to administer a half dose (80 mg).

## 2017-08-04 NOTE — Anesthesia Procedure Notes (Addendum)
Procedure Name: Intubation Date/Time: 08/04/2017 12:44 PM Performed by: Neldon Newport, CRNA Pre-anesthesia Checklist: Timeout performed, Patient being monitored, Suction available, Emergency Drugs available and Patient identified Patient Re-evaluated:Patient Re-evaluated prior to induction Oxygen Delivery Method: Circle system utilized Preoxygenation: Pre-oxygenation with 100% oxygen Induction Type: IV induction Ventilation: Mask ventilation without difficulty Laryngoscope Size: Mac and 4 Grade View: Grade I Tube type: Oral Tube size: 7.5 mm Number of attempts: 1 Placement Confirmation: breath sounds checked- equal and bilateral,  positive ETCO2 and ETT inserted through vocal cords under direct vision Secured at: 23 cm Tube secured with: Tape Dental Injury: Teeth and Oropharynx as per pre-operative assessment

## 2017-08-04 NOTE — H&P (Signed)
Spencer Todd is an 84 y.o. male.   Chief Complaint: Subdural hematoma HPI: 84 year old male transferred from the [facility] emergency department.  Patient presented today with increased weakness and headache.  Patient found to have a large left-sided subacute subdural hematoma with marked mass-effect.  Patient also with a small right convexity subdural without mass-effect.  Patient is not anticoagulated.  He has a past history including stage IV prostate cancer.  Is a chronic indwelling Foley catheter.  He reports headache.  He has some weakness in his right side.  His legs feel weak and unsteady.  He is suffering frequent falls.  He had a significant fall with blow to his head approximately 1 month ago.  No current difficulties with seizures.  Past Medical History:  Diagnosis Date  . Atherosclerotic vascular disease   . Varicose veins   . Venous insufficiency     Past Surgical History:  Procedure Laterality Date  . ABDOMINAL AORTOGRAM N/A 06/05/2017   Procedure: ABDOMINAL AORTOGRAM;  Surgeon: Waynetta Sandy, MD;  Location: [place] [facility];  Service: Cardiovascular;  Laterality: N/A;  . APPENDECTOMY  1976  . HERNIA REPAIR    . LOWER EXTREMITY ANGIOGRAPHY Bilateral 06/05/2017   Procedure: Lower Extremity Angiography;  Surgeon: Waynetta Sandy, MD;  Location: [place] [facility];  Service: Cardiovascular;  Laterality: Bilateral;  Bilateral   . PERIPHERAL VASCULAR BALLOON ANGIOPLASTY  06/05/2017   Procedure: PERIPHERAL VASCULAR BALLOON ANGIOPLASTY;  Surgeon: Waynetta Sandy, MD;  Location: [place] [facility];  Service: Cardiovascular;;  SFA and Peroneal    Family History  Problem Relation Age of Onset  . Hypertension Mother   . Heart disease Mother   . Heart disease Father        before age 61  . Heart attack Father   . Heart disease Brother   . Hyperlipidemia Brother   . Heart attack Brother    Social History:  reports that he has never smoked. He  has never used smokeless tobacco. He reports that he does not drink alcohol or use drugs.  Allergies:  Allergies  Allergen Reactions  . Penicillins Hives, Other (See Comments) and Hypertension    Has patient had a PCN reaction causing immediate rash, facial/tongue/throat swelling, SOB or lightheadedness with hypotension: Yes Has patient had a PCN reaction causing severe rash involving mucus membranes or skin necrosis: No Has patient had a PCN reaction that required hospitalization: No Has patient had a PCN reaction occurring within the last 10 years: No If all of the above answers are "NO", then may proceed with Cephalosporin use.   . Tetracyclines & Related Hives  . Vancomycin Other (See Comments)    Upset stomach     (Not in a hospital admission)  Results for orders placed or performed during the hospital encounter of 08/04/17 (from the past 48 hour(s))  CBG monitoring, ED     Status: None   Collection Time: 08/04/17  9:32 AM  Result Value Ref Range   Glucose-Capillary 95 70 - 99 mg/dL  CBC with Differential/Platelet     Status: Abnormal   Collection Time: 08/04/17 10:42 AM  Result Value Ref Range   WBC 5.3 4.0 - 10.5 K/uL   RBC 3.78 (L) 4.22 - 5.81 MIL/uL   Hemoglobin 11.4 (L) 13.0 - 17.0 g/dL   HCT 36.2 (L) 39.0 - 52.0 %   MCV 95.8 78.0 - 100.0 fL   MCH 30.2 26.0 - 34.0 pg   MCHC 31.5 30.0 -  36.0 g/dL   RDW 16.5 (H) 11.5 - 15.5 %   Platelets 234 150 - 400 K/uL   Neutrophils Relative % 77 %   Neutro Abs 4.1 1.7 - 7.7 K/uL   Lymphocytes Relative 13 %   Lymphs Abs 0.7 0.7 - 4.0 K/uL   Monocytes Relative 9 %   Monocytes Absolute 0.5 0.1 - 1.0 K/uL   Eosinophils Relative 1 %   Eosinophils Absolute 0.0 0.0 - 0.7 K/uL   Basophils Relative 0 %   Basophils Absolute 0.0 0.0 - 0.1 K/uL    Comment: Performed at [facility], [place] [address], [place], [place] [address]  Comprehensive metabolic panel     Status: Abnormal   Collection Time: 08/04/17 10:42 AM   Result Value Ref Range   Sodium 140 135 - 145 mmol/L   Potassium 4.4 3.5 - 5.1 mmol/L   Chloride 109 98 - 111 mmol/L    Comment: Please note change in reference range.   CO2 26 22 - 32 mmol/L   Glucose, Bld 113 (H) 70 - 99 mg/dL    Comment: Please note change in reference range.   BUN 21 8 - 23 mg/dL    Comment: Please note change in reference range.   Creatinine, Ser 0.69 0.61 - 1.24 mg/dL   Calcium 7.6 (L) 8.9 - 10.3 mg/dL   Total Protein 6.7 6.5 - 8.1 g/dL   Albumin 3.9 3.5 - 5.0 g/dL   AST 23 15 - 41 U/L   ALT 16 0 - 44 U/L    Comment: Please note change in reference range.   Alkaline Phosphatase 273 (H) 38 - 126 U/L   Total Bilirubin 0.8 0.3 - 1.2 mg/dL   GFR calc non Af Amer >60 >60 mL/min   GFR calc Af Amer >60 >60 mL/min    Comment: (NOTE) The eGFR has been calculated using the CKD EPI equation. This calculation has not been validated in all clinical situations. eGFR's persistently <60 mL/min signify possible Chronic Kidney Disease.    Anion gap 5 5 - 15    Comment: Performed at [facility], [place] [address] [place], [place] [address]  Urinalysis, Routine w reflex microscopic     Status: Abnormal   Collection Time: 08/04/17 10:42 AM  Result Value Ref Range   Color, Urine YELLOW YELLOW   APPearance CLEAR CLEAR   Specific Gravity, Urine 1.008 1.005 - 1.030   pH 7.0 5.0 - 8.0   Glucose, UA NEGATIVE NEGATIVE mg/dL   Hgb urine dipstick MODERATE (A) NEGATIVE   Bilirubin Urine NEGATIVE NEGATIVE   Ketones, ur NEGATIVE NEGATIVE mg/dL   Protein, ur NEGATIVE NEGATIVE mg/dL   Nitrite NEGATIVE NEGATIVE   Leukocytes, UA LARGE (A) NEGATIVE   RBC / HPF 11-20 0 - 5 RBC/hpf   WBC, UA >50 (H) 0 - 5 WBC/hpf   Bacteria, UA NONE SEEN NONE SEEN    Comment: Performed at [facility], [place] [address], [place], [place] [address]  I-Stat CG4 Lactic Acid, ED     Status: None   Collection Time: 08/04/17 10:44 AM  Result Value Ref Range   Lactic  Acid, Venous 0.65 0.5 - 1.9 mmol/L   Dg Chest 2 View  Result Date: 08/04/2017 CLINICAL DATA:  Worsening weakness. EXAM: CHEST - 2 VIEW COMPARISON:  Chest radiograph 11/04/2011. Cervical spine CT 06/23/2017. Outside nuclear medicine bone scan and abdomen and pelvis CT 04/12/2017. FINDINGS: The cardiomediastinal silhouette is within normal limits.  The left lateral costophrenic angle was incompletely imaged. Trace pleural effusions are not excluded given mild posterior costophrenic angle and right lateral costophrenic angle blunting. Large pleural effusions were partially visualized on last month's cervical spine CT. No airspace consolidation, edema, or pneumothorax is identified. Thoracolumbar scoliosis is noted. Heterogeneously sclerotic appearance of the bones diffusely corresponds to the findings on the prior outside abdominal CT and bone scan. IMPRESSION: No evidence of acute airspace disease. Possible tiny residual pleural effusions. Electronically Signed   By: Logan Bores M.D.   On: 08/04/2017 10:46   Ct Head Wo Contrast  Result Date: 08/04/2017 CLINICAL DATA:  Right greater than left-sided weakness. Multiple recent falls. EXAM: CT HEAD WITHOUT CONTRAST TECHNIQUE: Contiguous axial images were obtained from the base of the skull through the vertex without intravenous contrast. COMPARISON:  06/23/2017 FINDINGS: Brain: There are new mixed density subdural hematomas over both cerebral convexities which measure up to 8 mm in thickness on the right and 20 mm on the left. Left cerebral sulci are effaced and there is partial effacement of the left lateral ventricle with 8 mm of midline shift. There is no interval ventricular dilatation to indicate acute hydrocephalus. There is no evidence of acute large territory infarct. Cerebral white matter hypodensities are again noted and are nonspecific but compatible with chronic small vessel ischemic disease. Vascular: Calcified atherosclerosis at the skull base. No  hyperdense vessel. Skull: No fracture or focal osseous lesion. Sinuses/Orbits: Visualized paranasal sinuses and mastoid air cells are clear. Orbits are unremarkable. Other: None. IMPRESSION: New left larger than right subdural hematomas with 8 mm of rightward midline shift. Critical Value/emergent results were called by telephone at the time of interpretation on 08/04/2017 at 10:33 am to Dr. Blanchie Dessert , who verbally acknowledged these results. Electronically Signed   By: Logan Bores M.D.   On: 08/04/2017 10:34    Pertinent items noted in HPI and remainder of comprehensive ROS otherwise negative.  Blood pressure (!) 167/67, pulse 67, temperature 98.6 F (37 C), resp. rate 16, SpO2 100 %.  Patient is awake and aware.  He is mildly confused.  He appears uncomfortable.  Examination of his head ears eyes and throat is unremarkable.  Neck is reasonably supple.  Neurologically his cranial nerve function normal bilaterally.  Motor strength is 5/5 in his left and 4+/5 with a pronator drift on his right.  Sensory examination nonfocal.  Reflexes normal active.  No evidence of long track signs. Assessment/Plan Left convexity subacute subdural hematoma with mass-effect.  Plan left-sided bur hole evacuation of subdural hematoma.  Risks and benefits of been explained.  Patient and his family wish to proceed.  Mallie Mussel A Enjoli Tidd 08/04/2017, 12:20 PM

## 2017-08-04 NOTE — ED Provider Notes (Addendum)
[place] DEPT Provider Note   CSN: [id number] Arrival date & time: 08/04/17  9449     History   Chief Complaint Chief Complaint  Patient presents with  . Weakness    HPI Bensyn Bornemann is a 84 y.o. male.  Patient is an 84 year old male with a history of PAD, prostate cancer on current oral chemotherapy for the last 20 days and indwelling Foley catheter since April who is presenting today with weakness.  Patient states since Monday his bilateral legs have started feeling more and more weak.  He did see Dr. Malachy Mood several days ago and his chemotherapy medication dose was reduced but he has not taken any since Wednesday because he is concerned that this may be a side effect of the medication.  He denies any back pain or numbness in the legs.  Wife is also noted he seemed a little bit weaker in his right arm but patient denies.  He denies any visual changes, fever, cough or shortness of breath.  He has had no abdominal pain or vomiting.  Wife states until yesterday he had been eating well.  He does have an indwelling Foley catheter which is due to be changed on Tuesday.  The history is provided by the patient and the spouse.    Past Medical History:  Diagnosis Date  . Atherosclerotic vascular disease   . Varicose veins   . Venous insufficiency     Patient Active Problem List   Diagnosis Date Noted  . Varicose veins of left lower extremity with complications 67/59/1638  . PVD (peripheral vascular disease) ([place]) 10/29/2013  . Peripheral vascular disease, unspecified ([place]) 04/16/2013    Past Surgical History:  Procedure Laterality Date  . ABDOMINAL AORTOGRAM N/A 06/05/2017   Procedure: ABDOMINAL AORTOGRAM;  Surgeon: Waynetta Sandy, MD;  Location: [place] [facility];  Service: Cardiovascular;  Laterality: N/A;  . APPENDECTOMY  1976  . HERNIA REPAIR    . LOWER EXTREMITY ANGIOGRAPHY Bilateral 06/05/2017   Procedure: Lower Extremity  Angiography;  Surgeon: Waynetta Sandy, MD;  Location: [place] [facility];  Service: Cardiovascular;  Laterality: Bilateral;  Bilateral   . PERIPHERAL VASCULAR BALLOON ANGIOPLASTY  06/05/2017   Procedure: PERIPHERAL VASCULAR BALLOON ANGIOPLASTY;  Surgeon: Waynetta Sandy, MD;  Location: [place] [facility];  Service: Cardiovascular;;  SFA and Peroneal        Home Medications    Prior to Admission medications   Medication Sig Start Date End Date Taking? Authorizing Provider  Acetylcarnitine HCl (ACETYL L-CARNITINE PO) Take 1 capsule by mouth 2 (two) times daily.    [provider]  Acetylcysteine (NAC) 600 MG CAPS Take 600 mg by mouth 3 (three) times daily.    [provider]  albuterol (PROVENTIL HFA;VENTOLIN HFA) 108 (90 Base) MCG/ACT inhaler Inhale 1 puff into the lungs every 6 (six) hours as needed for wheezing or shortness of breath.    [provider]  Amino Acids (L-CARNITINE PO) Take 1 capsule by mouth 3 (three) times daily. UPLC Supplement    [provider]  b complex vitamins tablet Take 1 tablet by mouth 2 (two) times daily.    [provider]  CALCIUM CITRATE PO Take 2 tablets by mouth 2 (two) times daily.    [provider]  Coenzyme Q10 (CO Q 10) 100 MG CAPS Take 100 mg by mouth 4 (four) times daily.    [provider]  Cranberry (CRAN-MAX PO) Take 1 capsule by  mouth daily.    [provider]  D-Ribose (RIBOSE, D,) POWD Take 1 Scoop by mouth 3 (three) times daily.    [provider]  enzalutamide [name]) 40 MG capsule Take 4 capsules (160 mg total) by mouth daily. 07/26/17   Wyatt Portela, MD  MAGNESIUM PO Take 1 tablet by mouth 3 (three) times daily.     [provider]  Misc Natural Products (TART CHERRY ADVANCED PO) Take 1 capsule by mouth 2 (two) times daily.    [provider]  Multiple Vitamins-Minerals (ANTIOXIDANT PO) Take 1 tablet by mouth 2 (two)  times daily.    [provider]  Multiple Vitamins-Minerals (MULTIVITAMIN PO) Take 2 tablets by mouth daily.    [provider]  Nattokinase 100 MG CAPS Take 100 mg by mouth daily.     [provider]  OVER THE COUNTER MEDICATION Take 1 capsule by mouth daily. Gamma E Supplement    [provider]  Pomegranate, Punica granatum, (POMEGRANATE EXTRACT PO) Take 1 capsule by mouth 2 (two) times daily.    [provider]  Probiotic Product (PROBIOTIC PO) Take 1 capsule by mouth daily.    [provider]  Sodium Chloride-Xylitol (XLEAR SINUS CARE SPRAY NA) Place 1-2 sprays into both nostrils 2 (two) times daily as needed (for congestion).    [provider]  TURMERIC CURCUMIN PO Take 1 capsule by mouth daily.    [provider]  UBIQUINOL PO Take 1 capsule by mouth daily.    [provider]  vitamin C (ASCORBIC ACID) 500 MG tablet Take 1,000 mg by mouth 3 (three) times daily.    [provider]    Family History Family History  Problem Relation Age of Onset  . Hypertension Mother   . Heart disease Mother   . Heart disease Father        before age 19  . Heart attack Father   . Heart disease Brother   . Hyperlipidemia Brother   . Heart attack Brother     Social History Social History   Tobacco Use  . Smoking status: Never Smoker  . Smokeless tobacco: Never Used  Substance Use Topics  . Alcohol use: No    Alcohol/week: 0.0 oz  . Drug use: No     Allergies   Penicillins; Tetracyclines & related; and Vancomycin   Review of Systems Review of Systems  Cardiovascular:       Significant improvement in bilateral lower extremity swelling in the last few weeks due to increasing the protein in his diet and elevating his legs.  All other systems reviewed and are negative.    Physical Exam Updated Vital Signs BP (!) 176/61 (BP Location: Left Arm)   Pulse 66   Temp 98.6 F (37 C) (Oral)   Resp  17   SpO2 100%   Physical Exam  Constitutional: He is oriented to person, place, and time. He appears well-developed and well-nourished. No distress.  HENT:  Head: Normocephalic and atraumatic.  Mouth/Throat: Oropharynx is clear and moist.  No evidence of trauma to the head.  Eyes: Pupils are equal, round, and reactive to light. Conjunctivae and EOM are normal.  Neck: Normal range of motion. Neck supple.  Cardiovascular: Normal rate, regular rhythm and intact distal pulses.  No murmur heard. Pulmonary/Chest: Effort normal and breath sounds normal. No respiratory distress. He has no wheezes. He has no rales.  Abdominal: Soft. He exhibits no distension. There is no tenderness.  There is no rebound and no guarding.  Musculoskeletal: Normal range of motion. He exhibits no edema or tenderness.  No spinal tenderness.  Neurological: He is alert and oriented to person, place, and time.  Mild pronator drift on the right arm.  Strength 5 out of 5 in all 4 extremities.  Speech is normal.  Skin: Skin is warm and dry. No rash noted. No erythema.  Psychiatric: He has a normal mood and affect. His behavior is normal.  Nursing note and vitals reviewed.    ED Treatments / Results  Labs (all labs ordered are listed, but only abnormal results are displayed) Labs Reviewed  URINE CULTURE  CBC WITH DIFFERENTIAL/PLATELET  COMPREHENSIVE METABOLIC PANEL  URINALYSIS, ROUTINE W REFLEX MICROSCOPIC  CBG MONITORING, ED  I-STAT CG4 LACTIC ACID, ED    EKG EKG Interpretation  Date/Time:  Sunday August 04 2017 09:30:49 EDT Ventricular Rate:  63 PR Interval:    QRS Duration: 117 QT Interval:  468 QTC Calculation: 480 R Axis:   -40 Text Interpretation:  Sinus rhythm Prolonged PR interval Nonspecific IVCD with LAD Probable anteroseptal infarct, old Abnrm T, consider ischemia, anterolateral lds Baseline wander in lead(s) I II aVR aVF V2 No significant change since last tracing Confirmed by Blanchie Dessert  [id number]) on 08/04/2017 9:57:04 AM   Radiology Ct Head Wo Contrast  Result Date: 08/04/2017 CLINICAL DATA:  Right greater than left-sided weakness. Multiple recent falls. EXAM: CT HEAD WITHOUT CONTRAST TECHNIQUE: Contiguous axial images were obtained from the base of the skull through the vertex without intravenous contrast. COMPARISON:  06/23/2017 FINDINGS: Brain: There are new mixed density subdural hematomas over both cerebral convexities which measure up to 8 mm in thickness on the right and 20 mm on the left. Left cerebral sulci are effaced and there is partial effacement of the left lateral ventricle with 8 mm of midline shift. There is no interval ventricular dilatation to indicate acute hydrocephalus. There is no evidence of acute large territory infarct. Cerebral white matter hypodensities are again noted and are nonspecific but compatible with chronic small vessel ischemic disease. Vascular: Calcified atherosclerosis at the skull base. No hyperdense vessel. Skull: No fracture or focal osseous lesion. Sinuses/Orbits: Visualized paranasal sinuses and mastoid air cells are clear. Orbits are unremarkable. Other: None. IMPRESSION: New left larger than right subdural hematomas with 8 mm of rightward midline shift. Critical Value/emergent results were called by telephone at the time of interpretation on 08/04/2017 at 10:33 am to Dr. Blanchie Dessert , who verbally acknowledged these results. Electronically Signed   By: Logan Bores M.D.   On: 08/04/2017 10:34    Procedures Procedures (including critical care time)  Medications Ordered in ED Medications  sodium chloride 0.9 % bolus 500 mL (has no administration in time range)  0.9 %  sodium chloride infusion (has no administration in time range)     Initial Impression / Assessment and Plan / ED Course  I have reviewed the triage vital signs and the nursing notes.  Pertinent labs & imaging results that were available during my care of the patient  were reviewed by me and considered in my medical decision making (see chart for details).    Patient is an 84 year old male presenting today with more described generalized weakness.  In the last 2 days he has had 2 falls because his legs have given out.  No change in mental status but wife states he did have a headache early this morning but he denies having headache currently.  Patient denies any infectious symptoms but does have an indwelling Foley catheter.  The catheter is due to be changed on Tuesday and has been in for almost a month.  He denies any vomiting abdominal pain or diarrhea.  He does not look grossly dehydrated.  Patient does have mild pronator drift in the right upper extremity and concerned that symptoms may be related to stroke versus electrolyte abnormality versus worsening uremia versus anemia versus UTI.  Also patient is on Xtandi and he and his wife are concerned that he may be having a side effect to the medication.  He has been on this medication for approximately 20 days symptoms did not start until Monday. CT of the head, chest x-ray, EKG, CBC, CMP, UA, troponin pending.  10:44 AM Called by radiology and on head CT patient has a new left larger than right subdural hematoma with 8 mm of rightward midline shift.  This is most likely the cause of the patient's symptoms today.  Speaking with him he did have a bad fall approximately 1 month ago where he fell backwards hitting his head on the floor.  He was seen at [facility] at that time and had a CT which was negative.  Patient takes no prescription blood thinning medications but states he does take an herbal medication that is supposed to help thin your blood.  He has had no falls since that time until yesterday so suspect the bleeding has been gradual over the last month.  Will discuss with neurosurgery.  Patient remained stable.  11:00 AM Pt to [facility].   Final Clinical Impressions(s) / ED Diagnoses   Final  diagnoses:  Bilateral subdural hematomas [facility])    ED Discharge Orders    None       Blanchie Dessert, MD 08/04/17 1048    Blanchie Dessert, MD 08/04/17 1100

## 2017-08-04 NOTE — Transfer of Care (Signed)
Immediate Anesthesia Transfer of Care Note  Patient: Spencer Todd  Procedure(s) Performed: LEFT SIDED BURR HOLES FOR HEMATOMA EVACUATION SUBDURAL (Left Head)  Patient Location: PACU  Anesthesia Type:General  Level of Consciousness: awake and alert   Airway & Oxygen Therapy: Patient Spontanous Breathing and Patient connected to nasal cannula oxygen  Post-op Assessment: Report given to RN, Post -op Vital signs reviewed and stable and Patient moving all extremities X 4  Post vital signs: Reviewed and stable  Last Vitals:  Vitals Value Taken Time  BP 165/66 08/04/2017  1:39 PM  Temp    Pulse 69 08/04/2017  1:40 PM  Resp 18 08/04/2017  1:40 PM  SpO2 100 % 08/04/2017  1:40 PM  Vitals shown include unvalidated device data.  Last Pain:  Vitals:   08/04/17 1138  TempSrc:   PainSc: 0-No pain         Complications: No apparent anesthesia complications

## 2017-08-04 NOTE — Anesthesia Preprocedure Evaluation (Signed)
Anesthesia Evaluation  Patient identified by MRN, date of birth, ID band Patient awake    Reviewed: Allergy & Precautions, NPO status , Patient's Chart, lab work & pertinent test results  Airway Mallampati: II   Neck ROM: full    Dental   Pulmonary neg pulmonary ROS,    breath sounds clear to auscultation       Cardiovascular + Peripheral Vascular Disease   Rhythm:regular Rate:Normal     Neuro/Psych    GI/Hepatic   Endo/Other    Renal/GU      Musculoskeletal   Abdominal   Peds  Hematology  (+) anemia ,   Anesthesia Other Findings   Reproductive/Obstetrics                             Anesthesia Physical Anesthesia Plan  ASA: II and emergent  Anesthesia Plan: General   Post-op Pain Management:    Induction: Intravenous  PONV Risk Score and Plan: 2 and Ondansetron, Treatment may vary due to age or medical condition and Dexamethasone  Airway Management Planned: Oral ETT  Additional Equipment:   Intra-op Plan:   Post-operative Plan: Extubation in OR  Informed Consent: I have reviewed the patients History and Physical, chart, labs and discussed the procedure including the risks, benefits and alternatives for the proposed anesthesia with the patient or authorized representative who has indicated his/her understanding and acceptance.     Plan Discussed with: CRNA, Anesthesiologist and Surgeon  Anesthesia Plan Comments:         Anesthesia Quick Evaluation

## 2017-08-05 ENCOUNTER — Encounter (HOSPITAL_COMMUNITY): Payer: Self-pay | Admitting: Neurosurgery

## 2017-08-05 ENCOUNTER — Inpatient Hospital Stay (HOSPITAL_COMMUNITY): Payer: Medicare Other

## 2017-08-05 LAB — BASIC METABOLIC PANEL
Anion gap: 5 (ref 5–15)
BUN: 18 mg/dL (ref 8–23)
CALCIUM: 6.5 mg/dL — AB (ref 8.9–10.3)
CO2: 23 mmol/L (ref 22–32)
Chloride: 111 mmol/L (ref 98–111)
Creatinine, Ser: 0.75 mg/dL (ref 0.61–1.24)
GFR calc Af Amer: >60 mL/min (ref >60)
GLUCOSE: 117 mg/dL — AB (ref 70–99)
Potassium: 4.9 mmol/L (ref 3.5–5.1)
Sodium: 139 mmol/L (ref 135–145)

## 2017-08-05 LAB — CBC
HEMATOCRIT: 30 % — AB (ref 39.0–52.0)
Hemoglobin: 9.4 g/dL — ABNORMAL LOW (ref 13.0–17.0)
MCH: 30.1 pg (ref 26.0–34.0)
MCHC: 31.3 g/dL (ref 30.0–36.0)
MCV: 96.2 fL (ref 78.0–100.0)
Platelets: 193 10*3/uL (ref 150–400)
RBC: 3.12 MIL/uL — ABNORMAL LOW (ref 4.22–5.81)
RDW: 16.3 % — ABNORMAL HIGH (ref 11.5–15.5)
WBC: 6 10*3/uL (ref 4.0–10.5)

## 2017-08-05 MED FILL — Thrombin (Recombinant) For Soln 20000 Unit: CUTANEOUS | Qty: 1 | Status: AC

## 2017-08-05 NOTE — Progress Notes (Signed)
Postop day 1.  Patient denies headache.  Does note some neck pain.  No complaints of numbness or weakness.  No issues overnight.  Mildly hypertensive.  He is afebrile.  Heart rate stable.  Blood pressure acceptable.  Urine output good.  Drain output moderate.  Patient awake and alert.  He is oriented and reasonably appropriate.  Cranial nerve function normal bilateral.  Motor examination 5/5 bilateral upper and lower extremities.  Sensory examination intact.  Wound is clean and dry.  Follow-up PET CT scan this morning demonstrates near complete resolution of his left convexity subdural hematoma except from a small loculated area posterior to his left parietal bur hole.  No evidence of new hemorrhage or other problems.  Patient does have a little bit of expansion of his right-sided subdural fluid collection but I think this is more secondary to decrease in the pressure on the left and it is due to further bleeding on the right.    Continue ICU observation.  Follow-up head CT scan in morning.

## 2017-08-05 NOTE — Op Note (Signed)
Date of procedure: 08/04/2017  Date of dictation: 08/05/2017  Service: Neurosurgery  Preoperative diagnosis: Left convexity subacute subdural hematoma  Postoperative diagnosis: Same  Procedure Name: Left bur hole evacuation of subdural hematoma, placement of subdural drain  Surgeon:Damara Klunder A.Mosie Angus, M.D.  Asst. Surgeon: None  Anesthesia: General  Indication: 84 year old male with headaches and progressive right-sided weakness.  Work-up demonstrates evidence of a large left-sided subacute subdural hematoma with marked mass-effect.  Patient also with a small right-sided subdural fluid collection without mass-effect.  Patient presents now for bur hole evacuation of left-sided subdural hematoma.  Operative note: After induction of anesthesia, patient position supine with head turned over the right.  Left scalp was prepped and draped sterilely.  A frontal and parietal incisions were made.  Retractors were placed.  Bur holes were made.  Dura was coagulated and incised at both levels.  Dural leaflets were burned back to the edges of the bur hole.  Subdural fluid was allowed to drain under pressure.  The fluid was kind of mixed chronic fluid with mixed with some fresher hemorrhage.  All blood products were evacuated.  A subdural drain was passed from the parietal hole past the frontal hole under direct visualization.  Wounds were irrigated then closed.  Sterile dressings were applied.  No apparent complications.

## 2017-08-06 ENCOUNTER — Inpatient Hospital Stay (HOSPITAL_COMMUNITY): Payer: Medicare Other

## 2017-08-06 ENCOUNTER — Encounter (HOSPITAL_COMMUNITY): Payer: Self-pay | Admitting: Neurosurgery

## 2017-08-06 LAB — URINE CULTURE: Culture: 50000 — AB

## 2017-08-06 MED ORDER — HYDRALAZINE HCL 20 MG/ML IJ SOLN
10.0000 mg | INTRAMUSCULAR | Status: DC | PRN
  Administered 2017-08-06 – 2017-08-07 (×2): 20 mg via INTRAVENOUS
  Administered 2017-08-07: 10 mg via INTRAVENOUS
  Filled 2017-08-06 (×3): qty 1

## 2017-08-06 NOTE — Evaluation (Signed)
Occupational Therapy Evaluation Patient Details Name: Spencer Todd MRN: [id number] DOB: 15-Nov-1932 Today's Date: 08/06/2017    History of Present Illness pt is an 84 y/o male admitted with about a week long h/o increased weakness and HA.   Imaging showed large R SDH with midline shift.  7/21 to OR for L sided burr hole for evacuation of the hematoma. Drain removed 7/23.pt is an 84 y/o male admitted with about a week long h/o increased weakness and HA.   Imaging showed large R SDH with midline shift.  7/21 to OR for L sided burr hole for evacuation of the hematoma. Drain removed 7/23. PMH: PAD, LE angiography/baloon angioplasty.   Clinical Impression   Pt admitted with above. He demonstrates the below listed deficits and will benefit from continued OT to maximize safety and independence with BADLs.  Pt is doing very well post op.  He demonstrates mild balance deficits, mild cognitive deficits, generalized weakness.  He currently requires supervision - min A for ADLs.  He lives with wife, who can assist at discharge.  Will follow acutely.       Follow Up Recommendations  Home health OT;Supervision/Assistance - 24 hour    Equipment Recommendations  Tub/shower bench    Recommendations for Other Services       Precautions / Restrictions Precautions Precautions: Fall      Mobility Bed Mobility Overal bed mobility: Needs Assistance Bed Mobility: Rolling;Sidelying to Sit Rolling: Supervision Sidelying to sit: Supervision          Transfers Overall transfer level: Needs assistance   Transfers: Sit to/from Stand Sit to Stand: Min guard         General transfer comment: cues for hand placement    Balance Overall balance assessment: Needs assistance   Sitting balance-Leahy Scale: Fair       Standing balance-Leahy Scale: Fair(to poor) Standing balance comment: but reliant on RW or external support for gait                           ADL either performed or  assessed with clinical judgement   ADL Overall ADL's : Needs assistance/impaired Eating/Feeding: Independent   Grooming: Wash/dry hands;Wash/dry face;Oral care;Min guard;Standing   Upper Body Bathing: Set up;Supervision/ safety;Standing   Lower Body Bathing: Minimal assistance;Sit to/from stand   Upper Body Dressing : Set up;Supervision/safety;Sitting   Lower Body Dressing: Minimal assistance;Sit to/from stand   Toilet Transfer: Minimal assistance;Ambulation;Comfort height toilet;Grab bars;RW   Toileting- Clothing Manipulation and Hygiene: Minimal assistance;Sit to/from stand       Functional mobility during ADLs: Min guard;Minimal assistance General ADL Comments: assist for mild balance deficits      Vision Baseline Vision/History: Wears glasses Wears Glasses: At all times Patient Visual Report: No change from baseline Vision Assessment?: Yes Eye Alignment: Within Functional Limits Ocular Range of Motion: Within Functional Limits Alignment/Gaze Preference: Within Defined Limits Tracking/Visual Pursuits: Able to track stimulus in all quads without difficulty Convergence: Within functional limits     Perception Perception Perception Tested?: Yes   Praxis Praxis Praxis tested?: Within functional limits    Pertinent Vitals/Pain Pain Assessment: Faces Faces Pain Scale: Hurts a little bit Pain Location: neck Pain Descriptors / Indicators: Guarding Pain Intervention(s): Monitored during session     Hand Dominance     Extremity/Trunk Assessment Upper Extremity Assessment Upper Extremity Assessment: Generalized weakness   Lower Extremity Assessment Lower Extremity Assessment: Defer to PT evaluation RLE Deficits / Details: Right  LE weaker than left on eval.   Cervical / Trunk Assessment Cervical / Trunk Assessment: Other exceptions Cervical / Trunk Exceptions: in standing, pt with Rt lateral flexion    Communication Communication Communication: No  difficulties   Cognition Arousal/Alertness: Awake/alert Behavior During Therapy: WFL for tasks assessed/performed Overall Cognitive Status: Impaired/Different from baseline Area of Impairment: Attention;Following commands;Problem solving                   Current Attention Level: Selective   Following Commands: Follows one step commands consistently;Follows multi-step commands inconsistently     Problem Solving: Difficulty sequencing;Requires verbal cues General Comments: mild deficit noted    General Comments  wife and grand daughter present. Wife considering chair lift for stairs at home.  Discussed tub transfer bench      Exercises     Shoulder Instructions      Home Living Family/patient expects to be discharged to:: Private residence Living Arrangements: Spouse/significant other Available Help at Discharge: Family;Available 24 hours/day Type of Home: House Home Access: Stairs to enter [employer] of Steps: 2 small   Home Layout: Two level;Bed/bath upstairs;1/2 bath on main level Alternate Level Stairs-Number of Steps: 73flight Alternate Level Stairs-Rails: Right;Left Bathroom Shower/Tub: Tub/shower unit;Walk-in shower   Bathroom Toilet: Standard     Home Equipment: Walker - 4 wheels          Prior Functioning/Environment Level of Independence: Independent with assistive device(s)        Comments: pt does not drive, goes with his wife on outings,  Independent with rollator in the home.        OT Problem List: Decreased strength;Decreased activity tolerance;Impaired balance (sitting and/or standing);Decreased cognition;Decreased safety awareness;Decreased knowledge of use of DME or AE      OT Treatment/Interventions: Self-care/ADL training;DME and/or AE instruction;Therapeutic activities;Cognitive remediation/compensation;Patient/family education;Balance training    OT Goals(Current goals can be found in the care plan section) Acute Rehab  OT Goals Patient Stated Goal: home and able to get out and about OT Goal Formulation: With patient/family Time For Goal Achievement: 08/20/17 Potential to Achieve Goals: Good ADL Goals Pt Will Perform Grooming: with supervision;standing Pt Will Perform Upper Body Bathing: with set-up;sitting Pt Will Perform Lower Body Bathing: with supervision;sit to/from stand Pt Will Perform Upper Body Dressing: with set-up;sitting Pt Will Perform Lower Body Dressing: with supervision;sit to/from stand Pt Will Transfer to Toilet: with supervision;ambulating;regular height toilet;bedside commode;grab bars Pt Will Perform Toileting - Clothing Manipulation and hygiene: with supervision;sit to/from stand Pt Will Perform Tub/Shower Transfer: Tub transfer;with supervision;ambulating;tub bench;rolling walker  OT Frequency: Min 2X/week   Barriers to D/C:            Co-evaluation PT/OT/SLP Co-Evaluation/Treatment: Yes Reason for Co-Treatment: For patient/therapist safety   OT goals addressed during session: ADL's and self-care      AM-PAC PT "6 Clicks" Daily Activity     Outcome Measure Help from another person eating meals?: None Help from another person taking care of personal grooming?: A Little Help from another person toileting, which includes using toliet, bedpan, or urinal?: A Little Help from another person bathing (including washing, rinsing, drying)?: A Little Help from another person to put on and taking off regular upper body clothing?: A Little Help from another person to put on and taking off regular lower body clothing?: A Little 6 Click Score: 19   End of Session Equipment Utilized During Treatment: Gait belt;Rolling walker Nurse Communication: Mobility status  Activity Tolerance: Patient tolerated treatment well Patient  left: in chair;with call bell/phone within reach;with family/visitor present  OT Visit Diagnosis: Unsteadiness on feet (R26.81);Cognitive communication deficit  (R41.841)                Time: 2878-6767 OT Time Calculation (min): 32 min Charges:  OT General Charges $OT Visit: 1 Visit OT Evaluation $OT Eval Moderate Complexity: 1 Mod G-Codes:     [facility], OTR/L [phone]   Lucille Passy M 08/06/2017, 2:37 PM

## 2017-08-06 NOTE — Progress Notes (Signed)
   08/06/17 1100  Clinical Encounter Type  Visited With Family  Visit Type Initial  Consult/Referral To Chaplain  Spiritual Encounters  Spiritual Needs Emotional  Stress Factors  Family Stress Factors Health changes  Chaplain visited with the family present in the room.  Lights were off and shade was drawn.  Family was sitting in the corner with some food.  Chaplain gave an introduction to spiritual care are their role in the room and hospital.  Family has supportive church family and had no immediate needs at this time.

## 2017-08-06 NOTE — Evaluation (Signed)
Physical Therapy Evaluation Patient Details Name: Spencer Todd MRN: [id number] DOB: 1932-07-24 Today's Date: 08/06/2017   History of Present Illness  pt is an 84 y/o male admitted with about a week long h/o increased weakness and HA.   Imaging showed large R SDH with midline shift.  7/21 to OR for L sided burr hole for evacuation of the hematoma. Drain removed 7/23.pt is an 84 y/o male admitted with about a week long h/o increased weakness and HA.   Imaging showed large R SDH with midline shift.  7/21 to OR for L sided burr hole for evacuation of the hematoma. Drain removed 7/23. PMH: PAD, LE angiography/baloon angioplasty.  Clinical Impression  Pt admitted with/for weakness and HA due to large R SDH s/p burr hole.  Pt currently limited functionally due to the problems listed below.  (see problems list.)  Pt will benefit from PT to maximize function and safety to be able to get home safely with available assist.     Follow Up Recommendations Home health PT(progress to OPPT as needed)    Equipment Recommendations  None recommended by PT    Recommendations for Other Services       Precautions / Restrictions Precautions Precautions: Fall      Mobility  Bed Mobility Overal bed mobility: Needs Assistance Bed Mobility: Rolling;Sidelying to Sit Rolling: Supervision Sidelying to sit: Supervision          Transfers Overall transfer level: Needs assistance   Transfers: Sit to/from Stand Sit to Stand: Min guard         General transfer comment: cues for hand placement  Ambulation/Gait Ambulation/Gait assistance: Min guard Gait Distance (Feet): 120 Feet Assistive device: Rolling walker (2 wheeled) Gait Pattern/deviations: Decreased stride length Gait velocity: prefers slower, but able to speed up noticeably with cues Gait velocity interpretation: <1.8 ft/sec, indicate of risk for recurrent falls General Gait Details: lower amplitude steps generally contacting flat foot.   With cues pt could correct for heel/toe gait, but degrades without focus.  Stairs            Wheelchair Mobility    Modified Rankin (Stroke Patients Only)       Balance Overall balance assessment: Needs assistance   Sitting balance-Leahy Scale: Fair       Standing balance-Leahy Scale: Fair(to poor) Standing balance comment: but reliant on RW or external support for gait                             Pertinent Vitals/Pain Pain Assessment: Faces Faces Pain Scale: Hurts a little bit Pain Location: neck Pain Descriptors / Indicators: Guarding Pain Intervention(s): Monitored during session    Home Living Family/patient expects to be discharged to:: Private residence Living Arrangements: Spouse/significant other Available Help at Discharge: Family;Available 24 hours/day Type of Home: House Home Access: Stairs to enter   [employer] of Steps: 2 small Home Layout: Two level;Bed/bath upstairs;1/2 bath on main level Home Equipment: Walker - 4 wheels      Prior Function Level of Independence: Independent with assistive device(s)         Comments: pt does not drive, goes with his wife on outings,  Independent with rollator in the home.     Hand Dominance        Extremity/Trunk Assessment   Upper Extremity Assessment Upper Extremity Assessment: Defer to OT evaluation    Lower Extremity Assessment Lower Extremity Assessment: Overall WFL for tasks assessed;RLE  deficits/detail(right proximal musculature notably weaker than left. ) RLE Deficits / Details: Right LE weaker than left on eval.       Communication   Communication: No difficulties  Cognition Arousal/Alertness: Awake/alert Behavior During Therapy: WFL for tasks assessed/performed Overall Cognitive Status: Within Functional Limits for tasks assessed                                        General Comments      Exercises     Assessment/Plan    PT  Assessment Patient needs continued PT services  PT Problem List Decreased strength;Decreased activity tolerance;Decreased balance;Decreased mobility;Decreased knowledge of use of DME;Pain       PT Treatment Interventions Gait training;Stair training;Functional mobility training;Therapeutic activities;Patient/family education;Balance training    PT Goals (Current goals can be found in the Care Plan section)  Acute Rehab PT Goals Patient Stated Goal: home and able to get out and about PT Goal Formulation: With patient Time For Goal Achievement: 08/20/17 Potential to Achieve Goals: Good    Frequency Min 3X/week   Barriers to discharge        Co-evaluation               AM-PAC PT "6 Clicks" Daily Activity  Outcome Measure Difficulty turning over in bed (including adjusting bedclothes, sheets and blankets)?: None Difficulty moving from lying on back to sitting on the side of the bed? : None Difficulty sitting down on and standing up from a chair with arms (e.g., wheelchair, bedside commode, etc,.)?: A Little Help needed moving to and from a bed to chair (including a wheelchair)?: A Little Help needed walking in hospital room?: A Little Help needed climbing 3-5 steps with a railing? : A Little 6 Click Score: 20    End of Session   Activity Tolerance: Patient tolerated treatment well Patient left: in chair;with call bell/phone within reach;with family/visitor present Nurse Communication: Mobility status PT Visit Diagnosis: Unsteadiness on feet (R26.81)    Time: 4818-5631 PT Time Calculation (min) (ACUTE ONLY): 32 min   Charges:   PT Evaluation $PT Eval Moderate Complexity: 1 Mod     PT G Codes:        09/02/17  Spencer Todd, PT [phone]  (pager)  Spencer Todd 09/02/17, 2:16 PM

## 2017-08-06 NOTE — Anesthesia Postprocedure Evaluation (Signed)
Anesthesia Post Note  Patient: Mycheal Veldhuizen  Procedure(s) Performed: LEFT SIDED BURR HOLES FOR HEMATOMA EVACUATION SUBDURAL (Left Head)     Patient location during evaluation: PACU Anesthesia Type: General Level of consciousness: awake and alert Pain management: pain level controlled Vital Signs Assessment: post-procedure vital signs reviewed and stable Respiratory status: spontaneous breathing, nonlabored ventilation, respiratory function stable and patient connected to nasal cannula oxygen Cardiovascular status: blood pressure returned to baseline and stable Postop Assessment: no apparent nausea or vomiting Anesthetic complications: no    Last Vitals:  Vitals:   08/06/17 0900 08/06/17 1000  BP: (!) 143/64 (!) 143/59  Pulse: 67 (!) 59  Resp: 12 15  Temp:    SpO2: 98% 100%    Last Pain:  Vitals:   08/06/17 0800  TempSrc: Oral  PainSc: 0-No pain                 Kiyara Bouffard S

## 2017-08-06 NOTE — Progress Notes (Signed)
Overall doing well.  No headache.  No new difficulty with numbness or weakness.  Vital signs stable.  Blood pressure much better.  Urine output good.  Drain output moderate but predominantly CSF.  Awake and alert.  Oriented and appropriate.  Motor 5/5 bilaterally.  Sensory examination nonfocal.  Wound clean and dry.  Overall progressing well.  Plan on drain removal later today.

## 2017-08-07 ENCOUNTER — Other Ambulatory Visit: Payer: Self-pay

## 2017-08-07 MED ORDER — FAMOTIDINE 20 MG PO TABS
20.0000 mg | ORAL_TABLET | Freq: Two times a day (BID) | ORAL | Status: DC
  Administered 2017-08-07 – 2017-08-08 (×3): 20 mg via ORAL
  Filled 2017-08-07 (×3): qty 1

## 2017-08-07 NOTE — Progress Notes (Signed)
Physical Therapy Treatment Patient Details Name: Spencer Todd MRN: [id number] DOB: [birth year] Today's Date: 08/07/2017    History of Present Illness pt is an 84 y/o male admitted with about a week long h/o increased weakness and HA.   Imaging showed large R SDH with midline shift.  7/21 to OR for L sided burr hole for evacuation of the hematoma. Drain removed 7/23.pt is an 84 y/o male admitted with about a week long h/o increased weakness and HA.   Imaging showed large R SDH with midline shift.  7/21 to OR for L sided burr hole for evacuation of the hematoma. Drain removed 7/23. PMH: PAD, LE angiography/baloon angioplasty.    PT Comments    Progressing well toward goals.  Emphasis on gait stability and stairs.  Not independent, his wife know she needs to remain min guard to supervision for home mobility.   Follow Up Recommendations  Home health PT     Equipment Recommendations  None recommended by PT    Recommendations for Other Services       Precautions / Restrictions Precautions Precautions: Fall    Mobility  Bed Mobility   Bed Mobility: Supine to Sit     Supine to sit: Modified independent (Device/Increase time)        Transfers Overall transfer level: Needs assistance Equipment used: Rolling walker (2 wheeled) Transfers: Sit to/from Stand Sit to Stand: Supervision         General transfer comment: cues and reinforcement for hand placement  Ambulation/Gait Ambulation/Gait assistance: Min guard Gait Distance (Feet): 170 Feet Assistive device: Rolling walker (2 wheeled) Gait Pattern/deviations: Decreased stride length;Step-through pattern Gait velocity: slower Gait velocity interpretation: <1.8 ft/sec, indicate of risk for recurrent falls General Gait Details: lower amplitude steps generally contacting flat foot.  Fatigues noticeably, but wife will be able to handle min guard to supervision level   Stairs Stairs: Yes Stairs assistance: Min guard Stair  Management: One rail Left;One rail Right;Step to pattern;Forwards Number of Stairs: 3 General stair comments: safe with min guard from his wife.  Wife getting stair lift installed.   Wheelchair Mobility    Modified Rankin (Stroke Patients Only)       Balance Overall balance assessment: Needs assistance   Sitting balance-Leahy Scale: Fair       Standing balance-Leahy Scale: Fair Standing balance comment: but reliant on RW or external support for gait                            Cognition Arousal/Alertness: Awake/alert Behavior During Therapy: WFL for tasks assessed/performed Overall Cognitive Status: (NT, but follow commands well)                                        Exercises      General Comments General comments (skin integrity, edema, etc.): wife present to see that pt needs guarding only for the most part including the stairs.      Pertinent Vitals/Pain Pain Assessment: No/denies pain    Home Living                      Prior Function            PT Goals (current goals can now be found in the care plan section) Acute Rehab PT Goals Patient Stated Goal: home and able to get  out and about PT Goal Formulation: With patient Time For Goal Achievement: 08/20/17 Potential to Achieve Goals: Good Progress towards PT goals: Progressing toward goals    Frequency    Min 3X/week      PT Plan Current plan remains appropriate    Co-evaluation              AM-PAC PT "6 Clicks" Daily Activity  Outcome Measure  Difficulty turning over in bed (including adjusting bedclothes, sheets and blankets)?: None Difficulty moving from lying on back to sitting on the side of the bed? : None Difficulty sitting down on and standing up from a chair with arms (e.g., wheelchair, bedside commode, etc,.)?: A Little Help needed moving to and from a bed to chair (including a wheelchair)?: A Little Help needed walking in hospital room?:  A Little Help needed climbing 3-5 steps with a railing? : A Little 6 Click Score: 20    End of Session   Activity Tolerance: Patient tolerated treatment well Patient left: in bed;with call bell/phone within reach;with family/visitor present Nurse Communication: Mobility status PT Visit Diagnosis: Unsteadiness on feet (R26.81)     Time: 5329-9242 PT Time Calculation (min) (ACUTE ONLY): 21 min  Charges:  $Gait Training: 8-22 mins                    G Codes:       09/02/17  Donnella Sham, PT [phone]  (pager)   Tessie Fass Haylynn Pha September 02, 2017, 5:59 PM

## 2017-08-07 NOTE — Progress Notes (Signed)
Overall doing well.  Minimal headache.  No significant headache.  No symptoms of numbness or weakness.  Tolerating a regular diet.  Mobilizing without issue.  Afebrile.  Vital signs are stable.  Drain output stable.  Awake and alert.  Oriented and appropriate.  Motor and sensory function intact.  Abdomen soft.  Progressing well following bur hole evacuation of subdural hematoma.  Drain removed today.  Mobilize further.  Transfer to floor.  Probable discharge home tomorrow.

## 2017-08-07 NOTE — Progress Notes (Signed)
PHARMACIST - PHYSICIAN COMMUNICATION  DR:   Annette Stable  CONCERNING: IV to Oral Route Change Policy  RECOMMENDATION: This patient is receiving Famotidine by the intravenous route.  Based on criteria approved by the Pharmacy and Therapeutics Committee, the intravenous medication(s) is/are being converted to the equivalent oral dose form(s).   DESCRIPTION: These criteria include:  The patient is eating (either orally or via tube) and/or has been taking other orally administered medications for a least 24 hours  The patient has no evidence of active gastrointestinal bleeding or impaired GI absorption (gastrectomy, short bowel, patient on TNA or NPO).  If you have questions about this conversion, please contact the Pharmacy Department  []   [phone] )  Spen[facility] Todd []   [phone] )  [facility] [x]   [phone] )  Spencer Todd []   [phone] )  [facility] []   [phone] )  [place], [facility] 08/07/2017 9:14 AM

## 2017-08-08 ENCOUNTER — Ambulatory Visit: Payer: Medicare Other | Admitting: Podiatry

## 2017-08-08 NOTE — Discharge Instructions (Signed)

## 2017-08-08 NOTE — Discharge Summary (Signed)
Physician Discharge Summary  Patient ID: Spencer Todd MRN: [id number] DOB/AGE: 82-Feb-1934 82 y.o.  Admit date: 08/04/2017 Discharge date: 08/08/2017  Admission Diagnoses:  Discharge Diagnoses:  Active Problems:   S/P craniotomy   SDH (subdural hematoma) [facility])   Discharged Condition: good  Hospital Course: Patient admitted to the hospital for treatment of a newly discovered large left convexity subdural hematoma.  Patient underwent emergent bur hole evacuation of subdural hematoma.  Postoperatively much improved.  No headache.  Weakness has resolved.  Patient ambulating with therapy without difficulty.  Subdural drain kept in for 48 hours.  Follow-up head CT scan demonstrates resolution of majority of left convexity subdural hematoma.  Still with a small loculated component posteriorly in the left parietal region.  Also with some small amount of subdural blood chronically on the right hemisphere.  Overall patient doing well.  Plan for discharge home with home therapy.  Follow-up in 1 week.  Consults:   Significant Diagnostic Studies:   Treatments:   Discharge Exam: Blood pressure (!) 166/62, pulse 62, temperature 98.2 F (36.8 C), temperature source Oral, resp. rate 17, height 5\' 11"  (1.803 m), weight 72 kg (158 lb 11.7 oz), SpO2 100 %. Awake and alert.  Oriented and appropriate.  Speech fluent.  Judgment and insight are intact.  Cranial nerve function normal bilateral.  Motor 5/5 bilaterally.  No drift.  Wounds clean and dry.  Chest and abdomen benign.  Disposition: Discharge disposition: 01-Home or Self Care       Discharge Instructions    Face-to-face encounter (required for Medicare/Medicaid patients)   Complete by:  As directed    I Charlie Pitter certify that this patient is under my care and that I, or a nurse practitioner or physician's assistant working with me, had a face-to-face encounter that meets the physician face-to-face encounter requirements with this patient on  08/08/2017. The encounter with the patient was in whole, or in part for the following medical condition(s) which is the primary reason for home health care (List medical condition): subdural hematoma   The encounter with the patient was in whole, or in part, for the following medical condition, which is the primary reason for home health care:  subdural hematoma   I certify that, based on my findings, the following services are medically necessary home health services:  Physical therapy   Reason for Medically Necessary Home Health Services:  Therapy- Home Adaptation to Facilitate Safety   My clinical findings support the need for the above services:  Unable to leave home safely without assistance and/or assistive device   Further, I certify that my clinical findings support that this patient is homebound due to:  Unsafe ambulation due to balance issues   Home Health   Complete by:  As directed    To provide the following care/treatments:   PT OT       Allergies as of 08/08/2017      Reactions   Erythromycin Nausea Only   Mushroom Extract Complex Other (See Comments)   Has many allergies, so avoids this   Penicillins Hives, Other (See Comments), Hypertension   Has patient had a PCN reaction causing immediate rash, facial/tongue/throat swelling, SOB or lightheadedness with hypotension: Yes Has patient had a PCN reaction causing severe rash involving mucus membranes or skin necrosis: No Has patient had a PCN reaction that required hospitalization: No Has patient had a PCN reaction occurring within the last 10 years: No If all of the above answers are "NO", then  may proceed with Cephalosporin use.   Shellfish-derived Products Other (See Comments)   Has many allergies, so avoids this   Tetracyclines & Related Hives   Vancomycin Other (See Comments)   Upset stomach      Medication List    TAKE these medications   acetaminophen 325 MG tablet Commonly known as:  TYLENOL Take 162.5 mg by  mouth every 8 (eight) hours as needed (for headaches or pain).   ACETYL L-CARNITINE PO Take 1 capsule by mouth 2 (two) times daily.   albuterol 108 (90 Base) MCG/ACT inhaler Commonly known as:  PROVENTIL HFA;VENTOLIN HFA Inhale 1 puff into the lungs every 6 (six) hours as needed for wheezing or shortness of breath.   ANTIOXIDANT PO Take 1 capsule by mouth 2 (two) times daily.   b complex vitamins tablet Take 1 tablet by mouth 2 (two) times daily.   CALCIUM CITRATE PO Take 2 tablets by mouth 2 (two) times daily.   Co Q 10 100 MG Caps Take 100 mg by mouth 4 (four) times daily.   CRAN-MAX PO Take 1 capsule by mouth daily.   enzalutamide 40 MG capsule Commonly known as:  XTANDI Take 4 capsules (160 mg total) by mouth daily.   HYDROcodone-acetaminophen 5-325 MG tablet Commonly known as:  NORCO/VICODIN Take 1 tablet by mouth every 6 (six) hours as needed.   L-CARNITINE PO Take 1 capsule by mouth 3 (three) times daily. GPLC Supplement   MAGNESIUM PO Take 2 tablets by mouth 3 (three) times daily.   NAC 600 MG Caps Generic drug:  Acetylcysteine Take 600 mg by mouth 3 (three) times daily.   Nattokinase 100 MG Caps Take 100 mg by mouth daily.   OVER THE COUNTER MEDICATION Take 1 capsule by mouth daily. Gamma E Supplement   POMEGRANATE EXTRACT PO Take 1 capsule by mouth 2 (two) times daily.   PROBIOTIC PO Take 1 capsule by mouth daily.   Ribose (D) Powd Take 1 Scoop by mouth 3 (three) times daily.   TART CHERRY ADVANCED PO Take 1 capsule by mouth 2 (two) times daily.   CURCUMAX PRO PO Take 1 capsule by mouth daily.   UBIQUINOL PO Take 1 capsule by mouth daily.   vitamin C 500 MG tablet Commonly known as:  ASCORBIC ACID Take 1,000 mg by mouth 3 (three) times daily.   XLEAR SINUS CARE SPRAY NA Place 1-2 sprays into both nostrils as needed (for congestion).        Signed: Cooper Render Khayman Kirsch 08/08/2017, 8:46 AM

## 2017-08-08 NOTE — Care Management Note (Signed)
Case Management Note  Patient Details  Name: Spencer Todd MRN: [id number] Date of Birth: 11-09-1932  Subjective/Objective: : Patient admitted to the hospital for treatment of a newly discovered large left convexity subdural hematoma.  Patient underwent emergent bur hole evacuation of subdural hematoma                     Action/Plan: Case manager spoke with patient concerning discharge plan. Choice offered for Home Health agency. Patient resides in [place], [place]. Case Manager called referral to [phone], faxed orders, demographics, H&P, discharge summary to [phone]. Patient will have family support at discharge.     Expected Discharge Date:  08/08/17               Expected Discharge Plan:  [place]  In-House Referral:  NA  Discharge planning Services  CM Consult  Post Acute Care Choice:  Durable Medical Equipment, Home Health Choice offered to:  Patient  DME Arranged:  Tub bench DME Agency:  [place] Arranged:  PT, OT [facility] Agency:  Other - See comment([facility], [place])  Status of Service:  Completed, signed off  If discussed at [place] of Stay Meetings, dates discussed:    Additional Comments:  Ninfa Meeker, RN 08/08/2017, 1:38 PM

## 2017-08-08 NOTE — Progress Notes (Signed)
Patient discharged per MD order. Pt in stable condition with VSS. Discharge teaching completed and ample time for questions were given. Patient and wife were very appreciative of the care they received. Patient taken out of facility in wheelchair by RN and safely assisted into vehicle with his wife.

## 2017-08-08 NOTE — Progress Notes (Signed)
Occupational Therapy Treatment Patient Details Name: Spencer Todd MRN: [id number] DOB: 1932-11-24 Today's Date: 08/08/2017    History of present illness pt is an 84 y/o male admitted with about a week long h/o increased weakness and HA.   Imaging showed large R SDH with midline shift.  7/21 to OR for L sided burr hole for evacuation of the hematoma. Drain removed 7/23.pt is an 84 y/o male admitted with about a week long h/o increased weakness and HA.   Imaging showed large R SDH with midline shift.  7/21 to OR for L sided burr hole for evacuation of the hematoma. Drain removed 7/23. PMH: PAD, LE angiography/baloon angioplasty.   OT comments  Pt progressing towards OT goals this session. Focus was balance/coordination for LB dressing and toilet transfer. Pt was min A for LB dressing, and min A for initial boost from bed (good recall of safe hand placement). Min guard once up and moving with RW. Reinforced shower safety, all questions answered. Thank you for the opportunity to serve this patient.   Follow Up Recommendations  Home health OT;Supervision/Assistance - 24 hour    Equipment Recommendations  Tub/shower bench    Recommendations for Other Services      Precautions / Restrictions Precautions Precautions: Fall Restrictions Weight Bearing Restrictions: No       Mobility Bed Mobility Overal bed mobility: Needs Assistance Bed Mobility: Supine to Sit     Supine to sit: Supervision        Transfers Overall transfer level: Needs assistance Equipment used: Rolling walker (2 wheeled) Transfers: Sit to/from Stand Sit to Stand: Supervision         General transfer comment: cues and reinforcement for hand placement    Balance Overall balance assessment: Needs assistance   Sitting balance-Leahy Scale: Fair       Standing balance-Leahy Scale: Fair Standing balance comment: but reliant on RW or external support for gait                           ADL either  performed or assessed with clinical judgement   ADL Overall ADL's : Needs assistance/impaired                     Lower Body Dressing: Minimal assistance;Sit to/from stand Lower Body Dressing Details (indicate cue type and reason): Pt able to adjust socks, assist getting underwear and pants over "sticky socks" Pt with fair balance to access LB for dressing Toilet Transfer: Minimal assistance;Ambulation;Comfort height toilet;Grab bars;RW Armed forces technical officer Details (indicate cue type and reason): min A for initial boost, then min guard/supervision   Toileting - Clothing Manipulation Details (indicate cue type and reason): foley cath permenant     Functional mobility during ADLs: Min guard;Minimal assistance General ADL Comments: assist for mild balance deficits      Vision       Perception     Praxis      Cognition Arousal/Alertness: Awake/alert Behavior During Therapy: WFL for tasks assessed/performed Overall Cognitive Status: (NT, but follow commands well)                                 General Comments: mild deficit noted         Exercises     Shoulder Instructions       General Comments wife present througout session    Pertinent Vitals/ Pain  Pain Assessment: No/denies pain Pain Intervention(s): Monitored during session;Repositioned  Home Living                                          Prior Functioning/Environment              Frequency  Min 2X/week        Progress Toward Goals  OT Goals(current goals can now be found in the care plan section)  Progress towards OT goals: Progressing toward goals  Acute Rehab OT Goals Patient Stated Goal: home and able to get out and about OT Goal Formulation: With patient/family Time For Goal Achievement: 08/20/17 Potential to Achieve Goals: Good  Plan Discharge plan remains appropriate;Frequency remains appropriate    Co-evaluation                  AM-PAC PT "6 Clicks" Daily Activity     Outcome Measure   Help from another person eating meals?: None Help from another person taking care of personal grooming?: A Little Help from another person toileting, which includes using toliet, bedpan, or urinal?: A Little Help from another person bathing (including washing, rinsing, drying)?: A Little Help from another person to put on and taking off regular upper body clothing?: A Little Help from another person to put on and taking off regular lower body clothing?: A Little 6 Click Score: 19    End of Session Equipment Utilized During Treatment: Gait belt;Rolling walker  OT Visit Diagnosis: Unsteadiness on feet (R26.81);Cognitive communication deficit (R41.841)   Activity Tolerance Patient tolerated treatment well   Patient Left in chair;with call bell/phone within reach;with family/visitor present;with nursing/sitter in room   Nurse Communication Mobility status(in room)        Time: 9628-3662 OT Time Calculation (min): 24 min  Charges: OT General Charges $OT Visit: 1 Visit OT Treatments $Self Care/Home Management : 23-37 mins  Hulda Humphrey OTR/L [place] 08/08/2017, 3:07 PM

## 2017-08-15 ENCOUNTER — Ambulatory Visit: Payer: Medicare Other | Admitting: Vascular Surgery

## 2017-08-20 ENCOUNTER — Other Ambulatory Visit: Payer: Self-pay | Admitting: Neurosurgery

## 2017-08-20 ENCOUNTER — Ambulatory Visit (INDEPENDENT_AMBULATORY_CARE_PROVIDER_SITE_OTHER): Payer: Medicare Other | Admitting: Podiatry

## 2017-08-20 ENCOUNTER — Encounter

## 2017-08-20 ENCOUNTER — Encounter: Payer: Self-pay | Admitting: Podiatry

## 2017-08-20 DIAGNOSIS — S065XAA Traumatic subdural hemorrhage with loss of consciousness status unknown, initial encounter: Secondary | ICD-10-CM

## 2017-08-20 DIAGNOSIS — M79676 Pain in unspecified toe(s): Secondary | ICD-10-CM

## 2017-08-20 DIAGNOSIS — B351 Tinea unguium: Secondary | ICD-10-CM | POA: Diagnosis not present

## 2017-08-20 DIAGNOSIS — S065X9A Traumatic subdural hemorrhage with loss of consciousness of unspecified duration, initial encounter: Secondary | ICD-10-CM

## 2017-08-20 NOTE — Progress Notes (Signed)
He presents today chief complaint of painful elongated toenails.  Objective: Pulses are barely palpable bilateral no open lesions or wounds.  Nails are thick yellow dystrophic clinically mycotic and painful palpation as well as debridement.  Assessment: Pain in limb secondary to onychomycosis.  Plan: Debridement of toenails 1 through 5 bilateral.

## 2017-08-30 ENCOUNTER — Telehealth: Payer: Self-pay | Admitting: Oncology

## 2017-08-30 ENCOUNTER — Inpatient Hospital Stay: Payer: Medicare Other

## 2017-08-30 ENCOUNTER — Inpatient Hospital Stay: Payer: Medicare Other | Attending: Oncology | Admitting: Oncology

## 2017-08-30 VITALS — BP 177/62 | HR 74 | Temp 98.3°F | Resp 18 | Ht 71.0 in | Wt 150.9 lb

## 2017-08-30 DIAGNOSIS — C61 Malignant neoplasm of prostate: Secondary | ICD-10-CM

## 2017-08-30 DIAGNOSIS — Z191 Hormone sensitive malignancy status: Secondary | ICD-10-CM | POA: Diagnosis not present

## 2017-08-30 DIAGNOSIS — C7951 Secondary malignant neoplasm of bone: Secondary | ICD-10-CM | POA: Diagnosis not present

## 2017-08-30 DIAGNOSIS — I739 Peripheral vascular disease, unspecified: Secondary | ICD-10-CM

## 2017-08-30 DIAGNOSIS — Z79899 Other long term (current) drug therapy: Secondary | ICD-10-CM | POA: Insufficient documentation

## 2017-08-30 DIAGNOSIS — R634 Abnormal weight loss: Secondary | ICD-10-CM | POA: Diagnosis not present

## 2017-08-30 DIAGNOSIS — I10 Essential (primary) hypertension: Secondary | ICD-10-CM | POA: Diagnosis not present

## 2017-08-30 LAB — CBC WITH DIFFERENTIAL (CANCER CENTER ONLY)
Basophils Absolute: 0 10*3/uL (ref 0.0–0.1)
Basophils Relative: 0 %
EOS ABS: 0.1 10*3/uL (ref 0.0–0.5)
Eosinophils Relative: 1 %
HCT: 30.3 % — ABNORMAL LOW (ref 38.4–49.9)
HEMOGLOBIN: 9.6 g/dL — AB (ref 13.0–17.1)
Lymphocytes Relative: 15 %
Lymphs Abs: 0.9 10*3/uL (ref 0.9–3.3)
MCH: 29.1 pg (ref 27.2–33.4)
MCHC: 31.7 g/dL — AB (ref 32.0–36.0)
MCV: 91.8 fL (ref 79.3–98.0)
MONO ABS: 0.6 10*3/uL (ref 0.1–0.9)
MONOS PCT: 9 %
NEUTROS ABS: 4.5 10*3/uL (ref 1.5–6.5)
NEUTROS PCT: 75 %
Platelet Count: 253 10*3/uL (ref 140–400)
RBC: 3.3 MIL/uL — ABNORMAL LOW (ref 4.20–5.82)
RDW: 16.1 % — ABNORMAL HIGH (ref 11.0–14.6)
WBC Count: 6.1 10*3/uL (ref 4.0–10.3)

## 2017-08-30 LAB — CMP (CANCER CENTER ONLY)
ALBUMIN: 3.3 g/dL — AB (ref 3.5–5.0)
ALK PHOS: 172 U/L — AB (ref 38–126)
ALT: 8 U/L (ref 0–44)
AST: 13 U/L — ABNORMAL LOW (ref 15–41)
Anion gap: 6 (ref 5–15)
BUN: 29 mg/dL — ABNORMAL HIGH (ref 8–23)
CHLORIDE: 109 mmol/L (ref 98–111)
CO2: 27 mmol/L (ref 22–32)
CREATININE: 0.81 mg/dL (ref 0.61–1.24)
Calcium: 8.3 mg/dL — ABNORMAL LOW (ref 8.9–10.3)
GFR, Estimated: >60 mL/min (ref >60)
GLUCOSE: 91 mg/dL (ref 70–99)
Potassium: 4.8 mmol/L (ref 3.5–5.1)
SODIUM: 142 mmol/L (ref 135–145)
Total Bilirubin: 0.3 mg/dL (ref 0.3–1.2)
Total Protein: 6.3 g/dL — ABNORMAL LOW (ref 6.5–8.1)

## 2017-08-30 NOTE — Telephone Encounter (Signed)
Gave patient avs and calendar of upcoming appts.  °

## 2017-08-30 NOTE — Progress Notes (Signed)
Hematology and Oncology Follow Up Visit  Spencer Todd [id number] 12/17/1932 84 y.o. 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     Prior Therapy: He is status post prostate biopsy obtained on April 25, 2017 which showed Gleason score 4+5 = 9 and at least 5 cores performed by Dr. Gloriann Loan.   Current therapy:  Androgen deprivation under the care of Dr. Gloriann Loan he has received a [place].  Xtandi 160 mg daily started in June 2019.  The dose was reduced to 80 mg in July 2019.  Interim History: Mr. [name] for a follow-up visit.  Since the last visit, he was hospitalized urgently in July 2019 after presenting with headaches and lethargy and found to have a dural hematoma evacuated on 08/05/2017.  He was discharged subsequently without any residual complications.  He has been recovering rather slowly and he is participating with physical therapy.  He is ambulating with the help of a walker without any falls or syncope.  He has tolerated Xtandi at 80 mg daily without any issues.  He denies any edema or excessive fatigue.  He does report some mild hypertension and amlodipine has been started.  He continues to have issues with losing weight although his appetite has been excellent.  He has been using nutritional supplements as well.   He does not report any headaches, blurry vision, syncope or seizures.  He denies any alteration of status or dizziness.  Does not report any fevers, chills or sweats.  Does not report any cough, wheezing or hemoptysis.  Does not report any chest pain, palpitation, orthopnea or leg edema.  Does not report any nausea, vomiting or abdominal pain.  Does not report any change in his bowel habits.  Does not report any bone pain or pathological fractures.    Does not report frequency,  urgency or hematuria.  Does not report any skin rashes or lesions.   Does not report any lymphadenopathy or petechiae.  He denies any changes in his mood.  Remaining review of systems is negative.    Medications: I have reviewed the patient's current medications.  Current Outpatient Medications  Medication Sig Dispense Refill  . acetaminophen (TYLENOL) 325 MG tablet Take 162.5 mg by mouth every 8 (eight) hours as needed (for headaches or pain).    . Acetylcarnitine HCl (ACETYL L-CARNITINE PO) Take 1 capsule by mouth 2 (two) times daily.    . Acetylcysteine (NAC) 600 MG CAPS Take 600 mg by mouth 3 (three) times daily.    Marland Kitchen albuterol (PROVENTIL HFA;VENTOLIN HFA) 108 (90 Base) MCG/ACT inhaler Inhale 1 puff into the lungs every 6 (six) hours as needed for wheezing or shortness of breath.    . Amino Acids (L-CARNITINE PO) Take 1 capsule by mouth 3 (three) times daily. GPLC Supplement    . b complex vitamins tablet Take 1 tablet by mouth 2 (two) times daily.    Marland Kitchen CALCIUM CITRATE PO Take 2 tablets by mouth 2 (two) times daily.    . Coenzyme Q10 (CO Q 10) 100 MG CAPS Take 100 mg by mouth 4 (four) times daily.    . Cranberry (CRAN-MAX PO) Take 1 capsule by mouth daily.    Marland Kitchen D-Ribose (RIBOSE, D,) POWD Take 1 Scoop by mouth 3 (three) times daily.    . enzalutamide (  XTANDI) 40 MG capsule Take 4 capsules (160 mg total) by mouth daily. 120 capsule 0  . HYDROcodone-acetaminophen (NORCO/VICODIN) 5-325 MG tablet Take 1 tablet by mouth every 6 (six) hours as needed.  0  . MAGNESIUM PO Take 2 tablets by mouth 3 (three) times daily.     . Misc Natural Products (CURCUMAX PRO PO) Take 1 capsule by mouth daily.    . Misc Natural Products (TART CHERRY ADVANCED PO) Take 1 capsule by mouth 2 (two) times daily.    . Multiple Vitamins-Minerals (ANTIOXIDANT PO) Take 1 capsule by mouth 2 (two) times daily.     . Nattokinase 100 MG CAPS Take 100 mg by mouth daily.     Marland Kitchen OVER THE COUNTER MEDICATION Take 1 capsule by mouth  daily. Gamma E Supplement    . Pomegranate, Punica granatum, (POMEGRANATE EXTRACT PO) Take 1 capsule by mouth 2 (two) times daily.    . Probiotic Product (PROBIOTIC PO) Take 1 capsule by mouth daily.    . Sodium Chloride-Xylitol (XLEAR SINUS CARE SPRAY NA) Place 1-2 sprays into both nostrils as needed (for congestion).     Marland Kitchen UBIQUINOL PO Take 1 capsule by mouth daily.    . vitamin C (ASCORBIC ACID) 500 MG tablet Take 1,000 mg by mouth 3 (three) times daily.     No current facility-administered medications for this visit.      Allergies:  Allergies  Allergen Reactions  . Erythromycin Nausea Only  . Mushroom Extract Complex Other (See Comments)    Has many allergies, so avoids this  . Penicillins Hives, Other (See Comments) and Hypertension    Has patient had a PCN reaction causing immediate rash, facial/tongue/throat swelling, SOB or lightheadedness with hypotension: Yes Has patient had a PCN reaction causing severe rash involving mucus membranes or skin necrosis: No Has patient had a PCN reaction that required hospitalization: No Has patient had a PCN reaction occurring within the last 10 years: No If all of the above answers are "NO", then may proceed with Cephalosporin use.   . Shellfish-Derived Products Other (See Comments)    Has many allergies, so avoids this  . Tetracyclines & Related Hives  . Vancomycin Other (See Comments)    Upset stomach    Past Medical History, Surgical history, Social history, and Family History were reviewed and updated.   Physical Exam:  Blood pressure (!) 177/62, pulse 74, temperature 98.3 F (36.8 C), temperature source Oral, resp. rate 18, height 5\' 11"  (1.803 m), weight 150 lb 14.4 oz (68.4 kg), SpO2 100 %.   ECOG: 1 General appearance: Comfortable appearing gentleman without distress. Head: Atraumatic without abnormalities. Oropharynx: Mucous membranes are moist and pink. Eyes: Pupils are equal and round reactive to light without any  injection of sclera. Lymph nodes: No lymphadenopathy noted in the cervical, supraclavicular, or axillary lymph nodes. Heart:regular rate and rhythm, without any murmurs or gallops. Lung: Clear without any wheezes or dullness to percussion. Abdomin: Soft, nondistended without any rebound or guarding. Neurological: No deficits noted on exam. Skin: No ecchymosis or petechiae. Musculoskeletal: No clubbing or cyanosis. His mood is appropriate as well as his affect.    Lab Results: Lab Results  Component Value Date   WBC 6.0 08/05/2017   HGB 9.4 (L) 08/05/2017   HCT 30.0 (L) 08/05/2017   MCV 96.2 08/05/2017   PLT 193 08/05/2017     Chemistry      Component Value Date/Time   NA 139 08/05/2017 0243   K 4.9  08/05/2017 0243   CL 111 08/05/2017 0243   CO2 23 08/05/2017 0243   BUN 18 08/05/2017 0243   CREATININE 0.75 08/05/2017 0243   CREATININE 0.79 07/31/2017 1328      Component Value Date/Time   CALCIUM 6.5 (L) 08/05/2017 0243   ALKPHOS 273 (H) 08/04/2017 1042   AST 23 08/04/2017 1042   AST 19 07/31/2017 1328   ALT 16 08/04/2017 1042   ALT 19 07/31/2017 1328   BILITOT 0.8 08/04/2017 1042   BILITOT 0.4 07/31/2017 1328        Impression and Plan:  84 year old man with the following:  1.  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  He has documented disease to the bone.   He was started on Xtandi in June 2019 and tolerated the full dose poorly and his dose was decreased to 80 mg daily.  Risks and benefits of continuing this medication long-term was reviewed today.  He appears to have excellent benefit so far with PSA dropping to 25 with reasonable tolerance.  The natural course of his disease was also reviewed and alternative treatment options were discussed.  After discussion today, he is agreeable to continue with the same dose and schedule and continue to monitor his PSA periodically.  2.  Androgen deprivation: I have recommended  continuing androgen deprivation indefinitely.  He is currently receiving it under the care of Dr. Gloriann Loan.  3.  Bone directed therapy: He is currently on calcium and vitamin D.  Consideration for Delton See will be discussed in the future.  4.  Subdural hematoma: He is status post evacuation in July 2019 without any residual toxicities.  It is unclear whether Xtandi played a role in this incident at this time.  This is unlikely related to his [name] and likely related to a fall he sustained in June 2019.  5.  Weight loss: His appetite is excellent and anticipate he will start gaining weight soon.  I have discussed strategies to improve his nutritional intake and adding nutritional supplements.  6.  Hypertension: He is currently on amlodipine managed by his primary care physician.   6.  Goals of care: The goal of treatment remains palliative at this time.  His performance status is adequate and aggressive therapy is warranted.  7.  Follow-up: We will be in 6 weeks  15  minutes was spent with the patient face-to-face today.  More than 50% of time was dedicated to discussing the natural course of this disease and future treatment options as well as the rationale for continuing his treatments.   Zola Button, MD 8/16/20191:10 PM

## 2017-08-31 LAB — PROSTATE-SPECIFIC AG, SERUM (LABCORP): Prostate Specific Ag, Serum: 37.3 ng/mL — ABNORMAL HIGH (ref 0.0–4.0)

## 2017-09-05 ENCOUNTER — Ambulatory Visit
Admission: RE | Admit: 2017-09-05 | Discharge: 2017-09-05 | Disposition: A | Discharge status: Home / Self Care | Payer: Medicare Other | Source: Ambulatory Visit | Attending: Neurosurgery | Admitting: Neurosurgery

## 2017-09-05 DIAGNOSIS — S065XAA Traumatic subdural hemorrhage with loss of consciousness status unknown, initial encounter: Secondary | ICD-10-CM

## 2017-09-05 DIAGNOSIS — S065X9A Traumatic subdural hemorrhage with loss of consciousness of unspecified duration, initial encounter: Secondary | ICD-10-CM

## 2017-09-25 ENCOUNTER — Other Ambulatory Visit: Payer: Self-pay | Admitting: *Deleted

## 2017-09-25 MED ORDER — ENZALUTAMIDE 40 MG PO CAPS: 160.0000 mg | ORAL_CAPSULE | Freq: Every day | ORAL | 0 refills | Status: DC

## 2017-10-15 ENCOUNTER — Inpatient Hospital Stay: Payer: Medicare Other | Attending: Oncology

## 2017-10-15 ENCOUNTER — Telehealth: Payer: Self-pay

## 2017-10-15 ENCOUNTER — Inpatient Hospital Stay (HOSPITAL_BASED_OUTPATIENT_CLINIC_OR_DEPARTMENT_OTHER): Payer: Medicare Other | Admitting: Oncology

## 2017-10-15 VITALS — BP 188/76 | HR 64 | Temp 98.4°F | Resp 17 | Ht 71.0 in | Wt 167.7 lb

## 2017-10-15 DIAGNOSIS — I10 Essential (primary) hypertension: Secondary | ICD-10-CM | POA: Insufficient documentation

## 2017-10-15 DIAGNOSIS — C7951 Secondary malignant neoplasm of bone: Secondary | ICD-10-CM | POA: Insufficient documentation

## 2017-10-15 DIAGNOSIS — Z9079 Acquired absence of other genital organ(s): Secondary | ICD-10-CM | POA: Insufficient documentation

## 2017-10-15 DIAGNOSIS — I739 Peripheral vascular disease, unspecified: Secondary | ICD-10-CM

## 2017-10-15 DIAGNOSIS — R634 Abnormal weight loss: Secondary | ICD-10-CM

## 2017-10-15 DIAGNOSIS — C61 Malignant neoplasm of prostate: Secondary | ICD-10-CM

## 2017-10-15 DIAGNOSIS — Z79899 Other long term (current) drug therapy: Secondary | ICD-10-CM | POA: Insufficient documentation

## 2017-10-15 LAB — CBC WITH DIFFERENTIAL (CANCER CENTER ONLY)
BASOS PCT: 0 %
Basophils Absolute: 0 10*3/uL (ref 0.0–0.1)
Eosinophils Absolute: 0.2 10*3/uL (ref 0.0–0.5)
Eosinophils Relative: 3 %
HEMATOCRIT: 32.4 % — AB (ref 38.4–49.9)
Hemoglobin: 10.4 g/dL — ABNORMAL LOW (ref 13.0–17.1)
LYMPHS ABS: 1.2 10*3/uL (ref 0.9–3.3)
Lymphocytes Relative: 24 %
MCH: 29.7 pg (ref 27.2–33.4)
MCHC: 32.1 g/dL (ref 32.0–36.0)
MCV: 92.6 fL (ref 79.3–98.0)
MONO ABS: 0.5 10*3/uL (ref 0.1–0.9)
MONOS PCT: 10 %
NEUTROS ABS: 3.3 10*3/uL (ref 1.5–6.5)
Neutrophils Relative %: 63 %
Platelet Count: 148 10*3/uL (ref 140–400)
RBC: 3.5 MIL/uL — ABNORMAL LOW (ref 4.20–5.82)
RDW: 19.1 % — AB (ref 11.0–14.6)
WBC Count: 5.2 10*3/uL (ref 4.0–10.3)

## 2017-10-15 LAB — CMP (CANCER CENTER ONLY)
ALT: 10 U/L (ref 0–44)
ANION GAP: 5 (ref 5–15)
AST: 17 U/L (ref 15–41)
Albumin: 3.7 g/dL (ref 3.5–5.0)
Alkaline Phosphatase: 133 U/L — ABNORMAL HIGH (ref 38–126)
BUN: 28 mg/dL — ABNORMAL HIGH (ref 8–23)
CALCIUM: 8.6 mg/dL — AB (ref 8.9–10.3)
CO2: 27 mmol/L (ref 22–32)
Chloride: 107 mmol/L (ref 98–111)
Creatinine: 0.81 mg/dL (ref 0.61–1.24)
Glucose, Bld: 92 mg/dL (ref 70–99)
POTASSIUM: 5.1 mmol/L (ref 3.5–5.1)
Sodium: 139 mmol/L (ref 135–145)
TOTAL PROTEIN: 6.1 g/dL — AB (ref 6.5–8.1)
Total Bilirubin: 0.4 mg/dL (ref 0.3–1.2)

## 2017-10-15 NOTE — Telephone Encounter (Signed)
Printed avs and calender od upcoming appointment. Per 10/1 los

## 2017-10-15 NOTE — Progress Notes (Signed)
Hematology and Oncology Follow Up Visit  Spencer Todd [id number] May 21, 1932 84 y.o. 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  He presented with a Gleason score 4+5 = 9 and a PSA of 2718 and disease to the bone.     Prior Therapy: He is status post prostate biopsy obtained on April 25, 2017 which showed Gleason score 4+5 = 9 and at least 5 cores performed by Dr. Gloriann Loan.   Current therapy:  Androgen deprivation under the care of Dr. Gloriann Loan he has received a [place].  Xtandi 160 mg daily started in June 2019.  The dose was reduced to 80 mg in July 2019.  Interim History: Spencer Todd returns today for a follow-up.  Since her last visit, he reports no major changes in his health.  He continues to participate in physical therapy with improvement in his balance and gait.  He denies any recent falls or decline.  He continues to take Xtandi without any major complications.  He has tolerated 80 mg dosing without any issues.  He continues to tolerate Lupron and Xgeva under the care of Dr. Gloriann Loan without any issues.   He does not report any headaches, blurry vision, syncope or seizures.  He denies any dizziness or confusion.  Does not report any fevers, chills or sweats.  Does not report any cough, wheezing or hemoptysis.  Does not report any chest pain, palpitation, orthopnea or leg edema.  Does not report any nausea, vomiting or abdominal pain.  Does not report any constipation or diarrhea. Does not report any arthralgias or myalgias.   Does not report frequency, urgency or hematuria.  Does not report any skin rashes or lesions.   Does not report any lymphadenopathy or petechiae.  He denies any leading or clotting tendency.  Remaining review of systems is negative.    Medications: I have reviewed the patient's current medications.  Current Outpatient Medications  Medication Sig Dispense  Refill  . acetaminophen (TYLENOL) 325 MG tablet Take 162.5 mg by mouth every 8 (eight) hours as needed (for headaches or pain).    . Acetylcarnitine HCl (ACETYL L-CARNITINE PO) Take 1 capsule by mouth 2 (two) times daily.    . Acetylcysteine (NAC) 600 MG CAPS Take 600 mg by mouth 3 (three) times daily.    Marland Kitchen albuterol (PROVENTIL HFA;VENTOLIN HFA) 108 (90 Base) MCG/ACT inhaler Inhale 1 puff into the lungs every 6 (six) hours as needed for wheezing or shortness of breath.    . Amino Acids (L-CARNITINE PO) Take 1 capsule by mouth 3 (three) times daily. GPLC Supplement    . amLODipine (NORVASC) 2.5 MG tablet     . b complex vitamins tablet Take 1 tablet by mouth 2 (two) times daily.    Marland Kitchen CALCIUM CITRATE PO Take 2 tablets by mouth 2 (two) times daily.    . Coenzyme Q10 (CO Q 10) 100 MG CAPS Take 100 mg by mouth 4 (four) times daily.    . Cranberry (CRAN-MAX PO) Take 1 capsule by mouth daily.    Marland Kitchen D-Ribose (RIBOSE, D,) POWD Take 1 Scoop by mouth 3 (three) times daily.    . enzalutamide (XTANDI) 40 MG capsule Take 4 capsules (160 mg total) by mouth daily. 120 capsule 0  . HYDROcodone-acetaminophen (NORCO/VICODIN) 5-325 MG tablet Take 1 tablet by mouth every 6 (six) hours as needed.  0  . MAGNESIUM PO Take 2 tablets by mouth  3 (three) times daily.     . Misc Natural Products (CURCUMAX PRO PO) Take 1 capsule by mouth daily.    . Misc Natural Products (TART CHERRY ADVANCED PO) Take 1 capsule by mouth 2 (two) times daily.    . Multiple Vitamins-Minerals (ANTIOXIDANT PO) Take 1 capsule by mouth 2 (two) times daily.     . Nattokinase 100 MG CAPS Take 100 mg by mouth daily.     Marland Kitchen OVER THE COUNTER MEDICATION Take 1 capsule by mouth daily. Gamma E Supplement    . Pomegranate, Punica granatum, (POMEGRANATE EXTRACT PO) Take 1 capsule by mouth 2 (two) times daily.    . Probiotic Product (PROBIOTIC PO) Take 1 capsule by mouth daily.    . Sodium Chloride-Xylitol (XLEAR SINUS CARE SPRAY NA) Place 1-2 sprays into both  nostrils as needed (for congestion).     Marland Kitchen UBIQUINOL PO Take 1 capsule by mouth daily.    . vitamin C (ASCORBIC ACID) 500 MG tablet Take 1,000 mg by mouth 3 (three) times daily.     No current facility-administered medications for this visit.      Allergies:  Allergies  Allergen Reactions  . Erythromycin Nausea Only  . Mushroom Extract Complex Other (See Comments)    Has many allergies, so avoids this  . Penicillins Hives, Other (See Comments) and Hypertension    Has patient had a PCN reaction causing immediate rash, facial/tongue/throat swelling, SOB or lightheadedness with hypotension: Yes Has patient had a PCN reaction causing severe rash involving mucus membranes or skin necrosis: No Has patient had a PCN reaction that required hospitalization: No Has patient had a PCN reaction occurring within the last 10 years: No If all of the above answers are "NO", then may proceed with Cephalosporin use.   . Shellfish-Derived Products Other (See Comments)    Has many allergies, so avoids this  . Tetracyclines & Related Hives  . Vancomycin Other (See Comments)    Upset stomach    Past Medical History, Surgical history, Social history, and Family History were reviewed and updated.   Physical Exam:   Blood pressure (!) 188/76, pulse 64, temperature 98.4 F (36.9 C), temperature source Oral, resp. rate 17, height 5\' 11"  (1.803 m), weight 167 lb 11.2 oz (76.1 kg), SpO2 100 %.   ECOG: 1   General appearance: Comfortable appearing without any discomfort Head: Normocephalic without any trauma Oropharynx: Mucous membranes are moist and pink without any thrush or ulcers. Eyes: Pupils are equal and round reactive to light. Lymph nodes: No cervical, supraclavicular, inguinal or axillary lymphadenopathy.   Heart:regular rate and rhythm.  S1 and S2 without leg edema. Lung: Clear without any rhonchi or wheezes.  No dullness to percussion. Abdomin: Soft, nontender, nondistended with good  bowel sounds.  No hepatosplenomegaly. Musculoskeletal: No joint deformity or effusion.  Full range of motion noted. Neurological: No deficits noted on motor, sensory and deep tendon reflex exam. Skin: No petechial rash or dryness.  Appeared moist.  Psychiatric: Mood and affect appeared appropriate.      Lab Results: Lab Results  Component Value Date   WBC 6.1 08/30/2017   HGB 9.6 (L) 08/30/2017   HCT 30.3 (L) 08/30/2017   MCV 91.8 08/30/2017   PLT 253 08/30/2017     Chemistry      Component Value Date/Time   NA 142 08/30/2017 1301   K 4.8 08/30/2017 1301   CL 109 08/30/2017 1301   CO2 27 08/30/2017 1301   BUN 29 (  H) 08/30/2017 1301   CREATININE 0.81 08/30/2017 1301      Component Value Date/Time   CALCIUM 8.3 (L) 08/30/2017 1301   ALKPHOS 172 (H) 08/30/2017 1301   AST 13 (L) 08/30/2017 1301   ALT 8 08/30/2017 1301   BILITOT 0.3 08/30/2017 1301     Results for TIMOHTY, RENBARGER (MRN [id number]) as of 10/15/2017 15:41  Ref. Range 07/31/2017 13:28 08/30/2017 13:01  Prostate Specific Ag, Serum Latest Ref Range: 0.0 - 4.0 ng/mL 25.0 (H) 37.3 (H)     Impression and Plan:  84 year old man with the following:  1.  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   He is currently on Xtandi without any major complications.  Risks and benefits of continuing this therapy was reviewed today and is agreeable to continue.  2.  Androgen deprivation: He continues to receive Lupron under the care of Dr. Gloriann Loan which I recommend to continue indefinitely.  3.  Bone directed therapy: He is currently receiving Xgeva without any complication the care of Dr. Gloriann Loan.  4.  Subdural hematoma: Resolved at this time without any residual complications.  5.  Weight loss: He is continuing to improve his intake and have gained weight since last visit.  6.  Hypertension: His blood pressure outside of visits is within normal range.  His blood pressure is elevated  today I will continue to monitor him on his current therapy.   6.  Goals of care: His disease is incurable and the goal of treatment is to palliate symptoms moving forward.  Aggressive therapy is warranted given his reasonable performance status.  7.  Follow-up: We be in 4 months.  15  minutes was spent with the patient face-to-face today.  More than 50% of time was dedicated to reviewing his disease status, treatment options in the future and coordinating plan of care.   Zola Button, MD 10/1/20193:36 PM

## 2017-10-16 ENCOUNTER — Telehealth: Payer: Self-pay | Admitting: *Deleted

## 2017-10-16 LAB — PROSTATE-SPECIFIC AG, SERUM (LABCORP): Prostate Specific Ag, Serum: 42 ng/mL — ABNORMAL HIGH (ref 0.0–4.0)

## 2017-10-16 NOTE — Telephone Encounter (Signed)
As noted below by Dr. Alen Blew, I informed patient of his PSA level. No changes in the treatment. He verbalized understanding.

## 2017-10-16 NOTE — Telephone Encounter (Signed)
-----   Message from Wyatt Portela, MD sent at 10/16/2017  8:40 AM EDT ----- Please let him know his PSA has not changed much. No changes in treatment for now.

## 2017-11-14 ENCOUNTER — Encounter (HOSPITAL_COMMUNITY): Payer: Medicare Other

## 2017-11-14 ENCOUNTER — Ambulatory Visit: Payer: Medicare Other | Admitting: Vascular Surgery

## 2017-11-20 ENCOUNTER — Ambulatory Visit (INDEPENDENT_AMBULATORY_CARE_PROVIDER_SITE_OTHER): Payer: Medicare Other | Admitting: Podiatry

## 2017-11-20 ENCOUNTER — Encounter: Payer: Self-pay | Admitting: Podiatry

## 2017-11-20 DIAGNOSIS — B353 Tinea pedis: Secondary | ICD-10-CM | POA: Diagnosis not present

## 2017-11-20 DIAGNOSIS — I739 Peripheral vascular disease, unspecified: Secondary | ICD-10-CM | POA: Diagnosis not present

## 2017-11-20 DIAGNOSIS — M79676 Pain in unspecified toe(s): Secondary | ICD-10-CM

## 2017-11-20 DIAGNOSIS — B351 Tinea unguium: Secondary | ICD-10-CM | POA: Diagnosis not present

## 2017-11-20 MED ORDER — KETOCONAZOLE 2 % EX CREA: TOPICAL_CREAM | CUTANEOUS | 0 refills | Status: AC

## 2017-11-20 NOTE — Patient Instructions (Addendum)
 Athlete's Foot Athlete's foot (tinea pedis) is a fungal infection of the skin on the feet. It often occurs on the skin that is between or underneath the toes. It can also occur on the soles of the feet. The infection can spread from person to person (is contagious). What are the causes? Athlete's foot is caused by a fungus. This fungus grows in warm, moist places. Most people get athlete's foot by sharing shower stalls, towels, and wet floors with someone who is infected. Not washing your feet or changing your socks often enough can contribute to athlete's foot. What increases the risk? This condition is more likely to develop in:  Men.  People who have a weak body defense system (immune system).  People who have diabetes.  People who use public showers, such as at a gym.  People who wear heavy-duty shoes, such as industrial or military shoes.  Seasons with warm, humid weather.  What are the signs or symptoms? Symptoms of this condition include:  Itchy areas between the toes or on the soles of the feet.  White, flaky, or scaly areas between the toes or on the soles of the feet.  Very itchy small blisters between the toes or on the soles of the feet.  Small cuts on the skin. These cuts can become infected.  Thick or discolored toenails.  How is this diagnosed? This condition is diagnosed with a medical history and physical exam. Your health care provider may also take a skin or toenail sample to be examined. How is this treated? Treatment for this condition includes antifungal medicines. These may be applied as powders, ointments, or creams. In severe cases, an oral antifungal medicine may be given. Follow these instructions at home:  Apply or take over-the-counter and prescription medicines only as told by your health care provider.  Keep all follow-up visits as told by your health care provider. This is important.  Do not scratch your feet.  Keep your feet  dry: ? Wear cotton or wool socks. Change your socks every day or if they become wet. ? Wear shoes that allow air to circulate, such as sandals or canvas tennis shoes.  Wash and dry your feet: ? Every day or as told by your health care provider. ? After exercising. ? Including the area between your toes.  Do not share towels, nail clippers, or other personal items that touch your feet with others.  If you have diabetes, keep your blood sugar under control. How is this prevented?  Do not share towels.  Wear sandals in wet areas, such as locker rooms and shared showers.  Keep your feet dry: ? Wear cotton or wool socks. Change your socks every day or if they become wet. ? Wear shoes that allow air to circulate, such as sandals or canvas tennis shoes.  Wash and dry your feet after exercising. Pay attention to the area between your toes. Contact a health care provider if:  You have a fever.  You have swelling, soreness, warmth, or redness in your foot.  You are not getting better with treatment.  Your symptoms get worse.  You have new symptoms. This information is not intended to replace advice given to you by your health care provider. Make sure you discuss any questions you have with your health care provider. Document Released: 12/30/1999 Document Revised: 06/09/2015 Document Reviewed: 07/05/2014 Elsevier Interactive Patient Education  2018 Elsevier Inc.  Onychomycosis/Fungal Toenails  WHAT IS IT? An infection that lies within the   keratin of your nail plate that is caused by a fungus.  WHY ME? Fungal infections affect all ages, sexes, races, and creeds.  There may be many factors that predispose you to a fungal infection such as age, coexisting medical conditions such as diabetes, or an autoimmune disease; stress, medications, fatigue, genetics, etc.  Bottom line: fungus thrives in a warm, moist environment and your shoes offer such a location.  IS IT CONTAGIOUS?  Theoretically, yes.  You do not want to share shoes, nail clippers or files with someone who has fungal toenails.  Walking around barefoot in the same room or sleeping in the same bed is unlikely to transfer the organism.  It is important to realize, however, that fungus can spread easily from one nail to the next on the same foot.  HOW DO WE TREAT THIS?  There are several ways to treat this condition.  Treatment may depend on many factors such as age, medications, pregnancy, liver and kidney conditions, etc.  It is best to ask your doctor which options are available to you.  1. No treatment.   Unlike many other medical concerns, you can live with this condition.  However for many people this can be a painful condition and may lead to ingrown toenails or a bacterial infection.  It is recommended that you keep the nails cut short to help reduce the amount of fungal nail. 2. Topical treatment.  These range from herbal remedies to prescription strength nail lacquers.  About 40-50% effective, topicals require twice daily application for approximately 9 to 12 months or until an entirely new nail has grown out.  The most effective topicals are medical grade medications available through physicians offices. 3. Oral antifungal medications.  With an 80-90% cure rate, the most common oral medication requires 3 to 4 months of therapy and stays in your system for a year as the new nail grows out.  Oral antifungal medications do require blood work to make sure it is a safe drug for you.  A liver function panel will be performed prior to starting the medication and after the first month of treatment.  It is important to have the blood work performed to avoid any harmful side effects.  In general, this medication safe but blood work is required. 4. Laser Therapy.  This treatment is performed by applying a specialized laser to the affected nail plate.  This therapy is noninvasive, fast, and non-painful.  It is not covered by  insurance and is therefore, out of pocket.  The results have been very good with a 80-95% cure rate.  [facility] is the only practice in the area to offer this therapy. 5. Permanent Nail Avulsion.  Removing the entire nail so that a new nail will not grow back. 

## 2017-11-20 NOTE — Progress Notes (Signed)
Subjective: Spencer Todd presents today peripheral arterial disease and cc of painful, discolored, thick toenails which interfere with daily activities and routine tasks. Pain is aggravated when wearing enclosed shoe gear. Pain is getting progressively worse and relieved with periodic professional debridement.   Patient is followed closely by Vascular Surgery for PAD.  Spencer Todd voices no new problems on today's visit.  Objective: Vascular Examination: Capillary refill time <3 seconds x 10 digits Dorsalis pedis pulses faintly palpable b/l Posterior tibial pulses faintly palpable b/l No digital hair x 10 digits Skin temperature gradient WNL Trace edema b/l ankles  Dermatological Examination: Skin thin, shiny and atrophic b/l Toenails 1-5 b/l discolored, thick, dystrophic with subungual debris and pain with palpation to nailbeds due to thickness of nails. Diffuse scaling plantar aspect of both feet with scattered petechiae and mild foot odor consistent with tinea pedis No open wounds No interdigital maceration  Musculoskeletal: Muscle strength 5/5 to all LE muscle groups  Neurological: Sensation intact with 10 gram monofilament b/l Vibratory sensation intact b/l  Assessment: 1. Painful onychomycosis toenails 1-5 b/l 2. Tinea pedis b/l 3. Peripheral arterial disease  Plan: 1. Toenails 1-5 b/l were debrided in length and girth without iatrogenic bleeding. 2. Discussed dx of tinea pedis. AVS dispensed. Rx for Ketoconazole sent to [facility] in [place]. Patient to apply to both feet and between toes once daily for six weeks. 3. Patient to continue soft, supportive shoe gear 4. Patient to report any pedal injuries to medical professional  5. Follow up 3 months.  6. Patient/POA to call should there be a concern in the interim.

## 2017-11-26 ENCOUNTER — Ambulatory Visit (HOSPITAL_COMMUNITY)
Admission: RE | Admit: 2017-11-26 | Discharge: 2017-11-26 | Disposition: A | Discharge status: Home / Self Care | Payer: Medicare Other | Source: Ambulatory Visit | Attending: Vascular Surgery | Admitting: Vascular Surgery

## 2017-11-26 ENCOUNTER — Ambulatory Visit (INDEPENDENT_AMBULATORY_CARE_PROVIDER_SITE_OTHER)
Admission: RE | Admit: 2017-11-26 | Discharge: 2017-11-26 | Disposition: A | Discharge status: Home / Self Care | Payer: Medicare Other | Source: Ambulatory Visit | Attending: Vascular Surgery | Admitting: Vascular Surgery

## 2017-11-26 DIAGNOSIS — I6523 Occlusion and stenosis of bilateral carotid arteries: Secondary | ICD-10-CM | POA: Diagnosis not present

## 2017-11-26 DIAGNOSIS — I739 Peripheral vascular disease, unspecified: Secondary | ICD-10-CM | POA: Insufficient documentation

## 2017-11-28 ENCOUNTER — Telehealth: Payer: Self-pay

## 2017-11-28 NOTE — Telephone Encounter (Signed)
Oral Oncology Patient Advocate Encounter  [place] [employer] faxed me a renewal application but there was grant funding available for metastatic prostate cancer. I was successful at securing a grant with [facility] [place] (PAF) for [phone]. This will keep the out of pocket expense for Xtandi at $0. The grant information is as follows and has been shared with [facility].  Approval dates: 06/01/17-11/29/18 ID: [id number] Group: [id number] BIN: [id number] PCN: [id number]  I called the patient and gave him the good news. I also told him he would continue getting the Xtandi through 01/14/18. Then in January he would start getting Xtandi filled at [facility] using the grant to cover his copays. Mr. Lierman verbalized understanding and great appreciation.  [facility] [place] Phone [phone] Fax [phone]

## 2017-12-19 ENCOUNTER — Encounter: Payer: Self-pay | Admitting: Vascular Surgery

## 2017-12-19 ENCOUNTER — Ambulatory Visit (INDEPENDENT_AMBULATORY_CARE_PROVIDER_SITE_OTHER): Payer: Medicare Other | Admitting: Vascular Surgery

## 2017-12-19 ENCOUNTER — Other Ambulatory Visit: Payer: Self-pay

## 2017-12-19 VITALS — BP 178/72 | HR 74 | Temp 98.7°F | Resp 18 | Ht 71.0 in | Wt 170.0 lb

## 2017-12-19 DIAGNOSIS — I739 Peripheral vascular disease, unspecified: Secondary | ICD-10-CM | POA: Diagnosis not present

## 2017-12-19 DIAGNOSIS — I779 Disorder of arteries and arterioles, unspecified: Secondary | ICD-10-CM

## 2017-12-19 DIAGNOSIS — I6521 Occlusion and stenosis of right carotid artery: Secondary | ICD-10-CM | POA: Diagnosis not present

## 2017-12-19 NOTE — Progress Notes (Signed)
Patient is an 84 year old male who returns for follow-up today.  The ulcer on his left leg has now completely healed.  He has known peripheral arterial disease in the left lower extremity with previous left superficial femoral popliteal and peroneal artery angioplasty by Dr. Donzetta Matters on May 22.  He overall is fairly debilitated with fairly extensive metastatic prostate cancer.  He is quite frail.  He has become much more deconditioned since I saw him 6 months ago.  He currently has no nonhealing wounds.  He ambulates with a walker currently.  He required recent bur hole drainage of what sounds like a subdural after a fall back in September.  He has recovered somewhat from this.  He does not really ambulate enough to elicit claudication symptoms.  He is also here for carotid surveillance.  He has had no symptoms of TIA amaurosis or stroke.  Review of systems: Overall very deconditioned not really losing weight shortness of breath with minimal exertion  Current Outpatient Medications on File Prior to Visit  Medication Sig Dispense Refill  . acetaminophen (TYLENOL) 325 MG tablet Take 162.5 mg by mouth every 8 (eight) hours as needed (for headaches or pain).    . Acetylcarnitine HCl (ACETYL L-CARNITINE PO) Take 1 capsule by mouth 2 (two) times daily.    . Acetylcysteine (NAC) 600 MG CAPS Take 600 mg by mouth 3 (three) times daily.    Marland Kitchen albuterol (PROVENTIL HFA;VENTOLIN HFA) 108 (90 Base) MCG/ACT inhaler Inhale 1 puff into the lungs every 6 (six) hours as needed for wheezing or shortness of breath.    . Amino Acids (L-CARNITINE PO) Take 1 capsule by mouth 3 (three) times daily. GPLC Supplement    . amLODipine (NORVASC) 2.5 MG tablet     . b complex vitamins tablet Take 1 tablet by mouth 2 (two) times daily.    Marland Kitchen CALCIUM CITRATE PO Take 2 tablets by mouth 2 (two) times daily.    . cefdinir (OMNICEF) 300 MG capsule TK ONE C PO  BID  0  . Coenzyme Q10 (CO Q 10) 100 MG CAPS Take 100 mg by mouth 4 (four) times  daily.    . Cranberry (CRAN-MAX PO) Take 1 capsule by mouth daily.    Marland Kitchen D-Ribose (RIBOSE, D,) POWD Take 1 Scoop by mouth 3 (three) times daily.    . enzalutamide (XTANDI) 40 MG capsule Take 4 capsules (160 mg total) by mouth daily. 120 capsule 0  . HYDROcodone-acetaminophen (NORCO/VICODIN) 5-325 MG tablet Take 1 tablet by mouth every 6 (six) hours as needed.  0  . ketoconazole (NIZORAL) 2 % cream Apply to both feet and between toes once daily for 6 weeks. 30 g 0  . MAGNESIUM PO Take 2 tablets by mouth 3 (three) times daily.     . Misc Natural Products (CURCUMAX PRO PO) Take 1 capsule by mouth daily.    . Misc Natural Products (TART CHERRY ADVANCED PO) Take 1 capsule by mouth 2 (two) times daily.    . Multiple Vitamins-Minerals (ANTIOXIDANT PO) Take 1 capsule by mouth 2 (two) times daily.     . Nattokinase 100 MG CAPS Take 100 mg by mouth daily.     Marland Kitchen OVER THE COUNTER MEDICATION Take 1 capsule by mouth daily. Gamma E Supplement    . Pomegranate, Punica granatum, (POMEGRANATE EXTRACT PO) Take 1 capsule by mouth 2 (two) times daily.    . Probiotic Product (PROBIOTIC PO) Take 1 capsule by mouth daily.    . Sodium  Chloride-Xylitol (XLEAR SINUS CARE SPRAY NA) Place 1-2 sprays into both nostrils as needed (for congestion).     Marland Kitchen UBIQUINOL PO Take 1 capsule by mouth daily.    . vitamin C (ASCORBIC ACID) 500 MG tablet Take 1,000 mg by mouth 3 (three) times daily.     No current facility-administered medications on file prior to visit.     Past Medical History:  Diagnosis Date  . Atherosclerotic vascular disease   . Varicose veins   . Venous insufficiency    Past Surgical History:  Procedure Laterality Date  . ABDOMINAL AORTOGRAM N/A 06/05/2017   Procedure: ABDOMINAL AORTOGRAM;  Surgeon: Waynetta Sandy, MD;  Location: [facility];  Service: Cardiovascular;  Laterality: N/A;  . APPENDECTOMY  1976  . CRANIOTOMY Left 08/04/2017   Procedure: LEFT SIDED BURR HOLES FOR HEMATOMA  EVACUATION SUBDURAL;  Surgeon: Earnie Larsson, MD;  Location: [facility];  Service: Neurosurgery;  Laterality: Left;  . HERNIA REPAIR    . LOWER EXTREMITY ANGIOGRAPHY Bilateral 06/05/2017   Procedure: Lower Extremity Angiography;  Surgeon: Waynetta Sandy, MD;  Location: [facility];  Service: Cardiovascular;  Laterality: Bilateral;  Bilateral   . PERIPHERAL VASCULAR BALLOON ANGIOPLASTY  06/05/2017   Procedure: PERIPHERAL VASCULAR BALLOON ANGIOPLASTY;  Surgeon: Waynetta Sandy, MD;  Location: [facility];  Service: Cardiovascular;;  SFA and Peroneal    Physical exam:  Vitals:   12/19/17 1407  BP: (!) 178/72  Pulse: 74  Resp: 18  Temp: 98.7 F (37.1 C)  TempSrc: Oral  SpO2: 97%  Weight: 170 lb (77.1 kg)  Height: 5\' 11"  (1.803 m)    Neck: No carotid bruits Chest: Clear to auscultation bilaterally  Cardiac: Regular rate and rhythm  Extremities: 2+ femoral pulses absent popliteal and pedal pulses  Skin: 2 mm less than 1 mm depth ulceration right pretibial region  Musculoskeletal edema left leg greater than right 1+ primarily pretibial and pedal  Data: Patient had a carotid duplex exam today which shows 60 to 80% right internal carotid artery stenosis leaning more towards the 60% end of this.  No significant left internal carotid artery stenosis.  He also had bilateral ABIs right side was 0.8 left side was 0.57   Assessment: #1 peripheral arterial disease overall stable reasonable perfusion to the left foot status post angioplasty of his popliteal and tibial vessels.  He currently has no nonhealing wounds in the left leg.  He does not have rest pain.  He does not experience claudication because he ambulates minimally.  2.  Moderate carotid stenosis right side continue surveillance unless he develops symptoms of TIA amaurosis or stroke.  Would consider carotid endarterectomy in the event of that.  3.  Overall deconditioning.  The patient has had physical  therapy and benefited from this in the past.  We will see if we can get him physical therapy again to try to improve his overall strength and conditioning.  Patient will follow-up with a carotid duplex scan and ABIs in 6 months time.  Ruta Hinds, MD [facility] Office: [phone] Pager: [phone]

## 2017-12-23 ENCOUNTER — Telehealth: Payer: Self-pay | Admitting: Pharmacist

## 2017-12-23 DIAGNOSIS — C61 Malignant neoplasm of prostate: Secondary | ICD-10-CM

## 2017-12-23 MED ORDER — ENZALUTAMIDE 40 MG PO CAPS: 160.0000 mg | ORAL_CAPSULE | Freq: Every day | ORAL | 0 refills | Status: DC

## 2017-12-23 MED ORDER — ENZALUTAMIDE 40 MG PO CAPS: 80.0000 mg | ORAL_CAPSULE | Freq: Every day | ORAL | 0 refills | Status: DC

## 2017-12-23 NOTE — Telephone Encounter (Signed)
Oral Chemotherapy Pharmacist Encounter   Spoke with patient's wife today to follow up regarding patient's oral chemotherapy medication: Xtandi (enzalutamide) for the treatment of metastatic, castration-sensitive prostate cancer in conjunction with androgen deprivation therapy, planned duration until disease progression or unacceptable toxicity.  Original Start date of oral chemotherapy: 07/14/2017  Pt is doing well today  Pt reports 0 tablets/doses of Xtandi 40mg  capsules, 2 capsules (80mg ) by mouth once daily without regard to food missed in the last month.  Dose of Xtandi was reduced to 80mg  daily on 07/31/17 due to excessive fatigue associated with the higher dose. Mrs. Pittinger states patient takes his Xtandi at ~ 10:30 each morning.  Pt reports the following side effects: none to report at lower dose Patient is getting around with a cane or walker since subdural hematomas that were experienced in July 2019, but energy level remains adequate.  Pertinent labs reviewed: OK for continued treatment.  Other Issues:   New prescription for Xtandi e-scribed to [facility] for last fill patient will receive from [facility] patient assistance  Oral oncology patient advocate has secured a copayment grant for patient to receive Xtandi from the [place] outpatient pharmacy at [place] out of pocket cost starting in Jan 2020 - prescription has been sent to [facility] for this fill to occur early Jan 2020.  Mrs. Sivertsen knows to call the office with questions or concerns. Oral Oncology Clinic will continue to follow.  Johny Drilling, PharmD, BCPS, BCOP  12/23/2017 1:16 PM Oral Oncology Clinic [phone]

## 2017-12-26 ENCOUNTER — Telehealth: Payer: Self-pay

## 2017-12-26 NOTE — Telephone Encounter (Signed)
Oral Oncology Patient Advocate Encounter  Received notification from [place] that prior authorization for [name] is required.  PA submitted on CoverMyMeds Key [id number] Status is pending  Oral Oncology Clinic will continue to follow.  [place] Patient [place] Phone [phone] Fax [phone]

## 2017-12-26 NOTE — Telephone Encounter (Signed)
Oral Oncology Patient Advocate Encounter  Prior Authorization for [name] has been approved.    PA# [id number] Effective dates: 12/26/17 through 01/15/19  Oral Oncology Clinic will continue to follow.   [name] Patient [place] Phone [phone] Fax [phone]

## 2018-01-20 ENCOUNTER — Telehealth: Payer: Self-pay | Admitting: Oncology

## 2018-01-20 NOTE — Telephone Encounter (Signed)
Called pt re appt being changed due to prostate clinic - spoke with wife re appts. Moved to 2/7 because they need afternoon appts.

## 2018-01-24 ENCOUNTER — Telehealth: Payer: Self-pay

## 2018-01-24 NOTE — Telephone Encounter (Addendum)
Oral Oncology Patient Advocate Encounter  I was successful at securing a grant with Patient [facility]) for $7,300. This will keep the out of pocket expense for Xtandi at $0. The grant information is as follows and has been shared with [place].  Approval dates: 10/26/17-01/24/19 ID: [id number] Group: [id number] BIN: [id number] PCN: [id number]  I called the patient and gave him the good news, he verbalized understanding and appreciation.  [place] Patient [place] Phone [phone] Fax [phone]

## 2018-01-27 MED FILL — XTANDI 40 MG CAPSULE: 40 | 30 days supply | Qty: 60 | Fill #0

## 2018-01-27 NOTE — Telephone Encounter (Signed)
Oral Oncology Patient Advocate Encounter  Confirmed with [place] that [name] was shipped on 01/27/18 with a $0 copay using PAF grant.   [place] Patient [place] Phone [phone] Fax [phone]

## 2018-02-11 ENCOUNTER — Ambulatory Visit: Payer: Medicare Other | Admitting: Oncology

## 2018-02-11 ENCOUNTER — Other Ambulatory Visit: Payer: Medicare Other

## 2018-02-17 ENCOUNTER — Telehealth: Payer: Self-pay | Admitting: *Deleted

## 2018-02-17 NOTE — Telephone Encounter (Signed)
Made another attempt to get patient or wife on phone. Left voice mail on numbers listed for cell phone and home phone.

## 2018-02-17 NOTE — Telephone Encounter (Signed)
Return call to patient left message on voice mail to call this office back to scheduled an appoint with PA  due to c/o leg pain.

## 2018-02-18 ENCOUNTER — Telehealth: Payer: Self-pay | Admitting: Vascular Surgery

## 2018-02-18 NOTE — Telephone Encounter (Addendum)
Patient's wife came in to the office wanting an Rx for a lift chair for the patient. Pt was given an Rx.  She also said the patient is getting progressively weaker and she wants to know if he should be seen.  Says patient is on chemotherapy for Prostate CA.  Advised her to contact patient's Oncologist since the weakness may have more to do with his cancer than his vascular problems.  Wife was advised to go to the ED if he worsens or at least call the office.  Wife verbalized understanding.    Serge Main E., LPN.

## 2018-02-19 ENCOUNTER — Ambulatory Visit: Payer: Medicare Other | Admitting: Podiatry

## 2018-02-21 ENCOUNTER — Inpatient Hospital Stay (HOSPITAL_BASED_OUTPATIENT_CLINIC_OR_DEPARTMENT_OTHER): Payer: Medicare Other | Admitting: Oncology

## 2018-02-21 ENCOUNTER — Telehealth: Payer: Self-pay

## 2018-02-21 ENCOUNTER — Inpatient Hospital Stay: Payer: Medicare Other | Attending: Oncology

## 2018-02-21 VITALS — BP 162/71 | HR 88 | Temp 98.2°F | Resp 18 | Ht 71.0 in | Wt 155.0 lb

## 2018-02-21 DIAGNOSIS — R634 Abnormal weight loss: Secondary | ICD-10-CM | POA: Diagnosis not present

## 2018-02-21 DIAGNOSIS — I10 Essential (primary) hypertension: Secondary | ICD-10-CM | POA: Diagnosis not present

## 2018-02-21 DIAGNOSIS — I739 Peripheral vascular disease, unspecified: Secondary | ICD-10-CM | POA: Diagnosis not present

## 2018-02-21 DIAGNOSIS — C61 Malignant neoplasm of prostate: Secondary | ICD-10-CM | POA: Insufficient documentation

## 2018-02-21 DIAGNOSIS — Z79899 Other long term (current) drug therapy: Secondary | ICD-10-CM | POA: Diagnosis not present

## 2018-02-21 DIAGNOSIS — C7951 Secondary malignant neoplasm of bone: Secondary | ICD-10-CM | POA: Diagnosis not present

## 2018-02-21 DIAGNOSIS — R630 Anorexia: Secondary | ICD-10-CM | POA: Diagnosis not present

## 2018-02-21 LAB — CBC WITH DIFFERENTIAL (CANCER CENTER ONLY)
ABS IMMATURE GRANULOCYTES: 0.14 10*3/uL — AB (ref 0.00–0.07)
Basophils Absolute: 0 10*3/uL (ref 0.0–0.1)
Basophils Relative: 0 %
EOS PCT: 2 %
Eosinophils Absolute: 0.1 10*3/uL (ref 0.0–0.5)
HEMATOCRIT: 30.4 % — AB (ref 39.0–52.0)
HEMOGLOBIN: 10 g/dL — AB (ref 13.0–17.0)
Immature Granulocytes: 5 %
LYMPHS PCT: 29 %
Lymphs Abs: 0.8 10*3/uL (ref 0.7–4.0)
MCH: 29.7 pg (ref 26.0–34.0)
MCHC: 32.9 g/dL (ref 30.0–36.0)
MCV: 90.2 fL (ref 80.0–100.0)
MONO ABS: 0.2 10*3/uL (ref 0.1–1.0)
MONOS PCT: 8 %
Neutro Abs: 1.6 10*3/uL — ABNORMAL LOW (ref 1.7–7.7)
Neutrophils Relative %: 56 %
Platelet Count: 114 10*3/uL — ABNORMAL LOW (ref 150–400)
RBC: 3.37 MIL/uL — ABNORMAL LOW (ref 4.22–5.81)
RDW: 15.7 % — ABNORMAL HIGH (ref 11.5–15.5)
WBC Count: 2.9 10*3/uL — ABNORMAL LOW (ref 4.0–10.5)
nRBC: 1.4 % — ABNORMAL HIGH (ref 0.0–0.2)

## 2018-02-21 LAB — CMP (CANCER CENTER ONLY)
ALK PHOS: 304 U/L — AB (ref 38–126)
ALT: 15 U/L (ref 0–44)
AST: 85 U/L — ABNORMAL HIGH (ref 15–41)
Albumin: 3.5 g/dL (ref 3.5–5.0)
Anion gap: 10 (ref 5–15)
BILIRUBIN TOTAL: 0.8 mg/dL (ref 0.3–1.2)
BUN: 25 mg/dL — AB (ref 8–23)
CALCIUM: 8 mg/dL — AB (ref 8.9–10.3)
CO2: 23 mmol/L (ref 22–32)
CREATININE: 0.81 mg/dL (ref 0.61–1.24)
Chloride: 105 mmol/L (ref 98–111)
Glucose, Bld: 111 mg/dL — ABNORMAL HIGH (ref 70–99)
Potassium: 4.2 mmol/L (ref 3.5–5.1)
Sodium: 138 mmol/L (ref 135–145)
Total Protein: 6.6 g/dL (ref 6.5–8.1)

## 2018-02-21 NOTE — Telephone Encounter (Signed)
Printed avs and calender of upcoming appointment. Per 2/7 los 

## 2018-02-21 NOTE — Progress Notes (Signed)
Hematology and Oncology Follow Up Visit  Spencer Todd [id number] 1932/11/09 85 y.o. 02/21/2018 3:35 PM Settle, [name], MDSettle, [name], MD   Principle Diagnosis: 85 year old with advanced prostate cancer with disease to the bone diagnosed in April 2019.  He presented with castration-sensitive disease and Gleason score 4+5 = 9 and a PSA of 2718.      Prior Therapy: He is status post prostate biopsy obtained on April 25, 2017 which showed Gleason score 4+5 = 9 and at least 5 cores performed by Dr. Gloriann Loan.   Current therapy:  Androgen deprivation under the care of Dr. Gloriann Loan he has received a [place].  Xtandi 160 mg daily started in June 2019.  The dose was reduced to 80 mg in July 2019.  Interim History: Mr. Spencer Todd is here for a return evaluation.  Since last visit, he reports few complaints that has been chronic in nature but has worsened as of late.  He does report some mild anorexia continues to have issues with weight loss.  He also has chronic peripheral vascular disease but limited his mobility overall.  He denies any back pain or pathological fractures.  He denies any recent hospitalizations or illnesses.  He has tolerated Xtandi at the current dose without any complaints.  His performance status has been limited and is her quality of life has been overall poor.  Patient denied any alteration mental status, neuropathy, confusion or dizziness.  Denies any headaches or lethargy.  Denies any night sweats, weight loss or changes in appetite.  Denied orthopnea, dyspnea on exertion or chest discomfort.  Denies shortness of breath, difficulty breathing hemoptysis or cough.  Denies any abdominal distention, nausea, early satiety or dyspepsia.  Denies any hematuria, frequency, dysuria or nocturia.  Denies any skin irritation, dryness or rash.  Denies any ecchymosis or petechiae.  Denies any lymphadenopathy or clotting.  Denies any heat or cold intolerance.  Denies any anxiety or depression.  Remaining  review of system is negative.      Medications: I have reviewed the patient's current medications.  Current Outpatient Medications  Medication Sig Dispense Refill  . acetaminophen (TYLENOL) 325 MG tablet Take 162.5 mg by mouth every 8 (eight) hours as needed (for headaches or pain).    . Acetylcarnitine HCl (ACETYL L-CARNITINE PO) Take 1 capsule by mouth 2 (two) times daily.    . Acetylcysteine (NAC) 600 MG CAPS Take 600 mg by mouth 3 (three) times daily.    Marland Kitchen albuterol (PROVENTIL HFA;VENTOLIN HFA) 108 (90 Base) MCG/ACT inhaler Inhale 1 puff into the lungs every 6 (six) hours as needed for wheezing or shortness of breath.    . Amino Acids (L-CARNITINE PO) Take 1 capsule by mouth 3 (three) times daily. GPLC Supplement    . amLODipine (NORVASC) 2.5 MG tablet     . b complex vitamins tablet Take 1 tablet by mouth 2 (two) times daily.    Marland Kitchen CALCIUM CITRATE PO Take 2 tablets by mouth 2 (two) times daily.    . cefdinir (OMNICEF) 300 MG capsule TK ONE C PO  BID  0  . Coenzyme Q10 (CO Q 10) 100 MG CAPS Take 100 mg by mouth 4 (four) times daily.    . Cranberry (CRAN-MAX PO) Take 1 capsule by mouth daily.    Marland Kitchen D-Ribose (RIBOSE, D,) POWD Take 1 Scoop by mouth 3 (three) times daily.    . enzalutamide (XTANDI) 40 MG capsule Take 2 capsules (80 mg total) by mouth daily. 60 capsule 0  .  HYDROcodone-acetaminophen (NORCO/VICODIN) 5-325 MG tablet Take 1 tablet by mouth every 6 (six) hours as needed.  0  . ketoconazole (NIZORAL) 2 % cream Apply to both feet and between toes once daily for 6 weeks. 30 g 0  . MAGNESIUM PO Take 2 tablets by mouth 3 (three) times daily.     . Misc Natural Products (CURCUMAX PRO PO) Take 1 capsule by mouth daily.    . Misc Natural Products (TART CHERRY ADVANCED PO) Take 1 capsule by mouth 2 (two) times daily.    . Multiple Vitamins-Minerals (ANTIOXIDANT PO) Take 1 capsule by mouth 2 (two) times daily.     . Nattokinase 100 MG CAPS Take 100 mg by mouth daily.     Marland Kitchen OVER THE  COUNTER MEDICATION Take 1 capsule by mouth daily. Gamma E Supplement    . Pomegranate, Punica granatum, (POMEGRANATE EXTRACT PO) Take 1 capsule by mouth 2 (two) times daily.    . Probiotic Product (PROBIOTIC PO) Take 1 capsule by mouth daily.    . Sodium Chloride-Xylitol (XLEAR SINUS CARE SPRAY NA) Place 1-2 sprays into both nostrils as needed (for congestion).     Marland Kitchen UBIQUINOL PO Take 1 capsule by mouth daily.    . vitamin C (ASCORBIC ACID) 500 MG tablet Take 1,000 mg by mouth 3 (three) times daily.     No current facility-administered medications for this visit.      Allergies:  Allergies  Allergen Reactions  . Erythromycin Nausea Only  . Mushroom Extract Complex Other (See Comments)    Has many allergies, so avoids this  . Penicillins Hives, Other (See Comments) and Hypertension    Has patient had a PCN reaction causing immediate rash, facial/tongue/throat swelling, SOB or lightheadedness with hypotension: Yes Has patient had a PCN reaction causing severe rash involving mucus membranes or skin necrosis: No Has patient had a PCN reaction that required hospitalization: No Has patient had a PCN reaction occurring within the last 10 years: No If all of the above answers are "NO", then may proceed with Cephalosporin use.   . Shellfish-Derived Products Other (See Comments)    Has many allergies, so avoids this  . Tetracyclines & Related Hives  . Vancomycin Other (See Comments)    Upset stomach    Past Medical History, Surgical history, Social history, and Family History were reviewed and updated.   Physical Exam:  Blood pressure (!) 162/71, pulse 88, temperature 98.2 F (36.8 C), temperature source Oral, resp. rate 18, height 5\' 11"  (1.803 m), weight 155 lb (70.3 kg), SpO2 99 %.     ECOG: 1   General appearance: Alert, awake without any distress. Head: Atraumatic without abnormalities Oropharynx: Without any thrush or ulcers. Eyes: No scleral icterus. Lymph nodes: No  lymphadenopathy noted in the cervical, supraclavicular, or axillary nodes Heart:regular rate and rhythm, without any murmurs or gallops.   Lung: Clear to auscultation without any rhonchi, wheezes or dullness to percussion. Abdomin: Soft, nontender without any shifting dullness or ascites. Musculoskeletal: No clubbing or cyanosis. Neurological: No motor or sensory deficits. Skin: No rashes or lesions.       Lab Results: Lab Results  Component Value Date   WBC 5.2 10/15/2017   HGB 10.4 (L) 10/15/2017   HCT 32.4 (L) 10/15/2017   MCV 92.6 10/15/2017   PLT 148 10/15/2017     Chemistry      Component Value Date/Time   NA 139 10/15/2017 1531   K 5.1 10/15/2017 1531   CL 107  10/15/2017 1531   CO2 27 10/15/2017 1531   BUN 28 (H) 10/15/2017 1531   CREATININE 0.81 10/15/2017 1531      Component Value Date/Time   CALCIUM 8.6 (L) 10/15/2017 1531   ALKPHOS 133 (H) 10/15/2017 1531   AST 17 10/15/2017 1531   ALT 10 10/15/2017 1531   BILITOT 0.4 10/15/2017 1531      Results for Spencer Todd, Spencer Todd (MRN [id number]) as of 02/21/2018 14:57  Ref. Range 08/30/2017 13:01 10/15/2017 15:31  Prostate Specific Ag, Serum Latest Ref Range: 0.0 - 4.0 ng/mL 37.3 (H) 42.0 (H)     Impression and Plan:  85 year old man with the following:  1.  Advanced prostate cancer with disease to the bone diagnosed in April 2019.  He has castration-sensitive disease at this time.   He has been taking Xtandi without any major complications.  His PSA has shown a recent increase up to 42.0 in October 2019.  Risks and benefits of continuing Zytiga versus switching to different therapy was reviewed today.  Alternative therapies would include Zytiga or systemic chemotherapy.  At this point, I have recommended to increase his Xtandi dose to 120 mg daily and will repeat imaging studies for staging work-up in the next 2 weeks.  Alternative therapy may be needed if his PSA continues to rise.  2.  Androgen deprivation: He has  been receiving androgen deprivation therapy under the care of Dr. Gloriann Loan.  3.  Bone directed therapy: I recommended continuing Xgeva indefinitely.  He is currently receiving it under the care of Dr. Gloriann Loan.  4.  Hypertension: Mildly elevated today but has been under reasonable control.  5.  Weight loss: We have discussed strategies to boost his nutritional intake including nutritional supplements   6.  Goals of care: He has incurable disease that is rather aggressive at presentation.  It is very possible that he is developing castration resistant disease rather quickly which indicates a poor prognosis.  For the time being I recommended continuing aggressive therapy but consideration for less aggressive therapy may be needed in the future.  7.  Follow-up: We be in 4 to 6 weeks to follow his progress.  25  minutes was spent with the patient face-to-face today.  More than 50% of time was dedicated to reviewing his disease status, laboratory data and addressing complications related to his cancer.  Zola Button, MD 2/7/20203:35 PM

## 2018-02-22 LAB — PROSTATE-SPECIFIC AG, SERUM (LABCORP): Prostate Specific Ag, Serum: 1223 ng/mL — ABNORMAL HIGH (ref 0.0–4.0)

## 2018-02-26 ENCOUNTER — Ambulatory Visit: Payer: Medicare Other | Admitting: Podiatry

## 2018-02-27 ENCOUNTER — Telehealth: Payer: Self-pay

## 2018-02-27 NOTE — Telephone Encounter (Signed)
Oral Oncology Patient Advocate Spencer Todd support solutions notified me that they automatically re-enrolled the patient into their patient assistance program.   Approval dates: 02/27/18-01/15/19  The patient is currently getting Xtandi filled at [facility] with a grant. The patient can now start using the manufacturer assistance foundation since his grant will be out of funds soon.  I called the patient to give him this information and had to leave a voicemail.  [place] Patient [place] Phone [phone] Fax [phone]

## 2018-02-28 ENCOUNTER — Other Ambulatory Visit: Payer: Self-pay | Admitting: *Deleted

## 2018-02-28 DIAGNOSIS — C61 Malignant neoplasm of prostate: Secondary | ICD-10-CM

## 2018-02-28 MED ORDER — ENZALUTAMIDE 40 MG PO CAPS: 120.0000 mg | ORAL_CAPSULE | Freq: Every day | ORAL | 0 refills | Status: AC

## 2018-03-11 ENCOUNTER — Telehealth: Payer: Self-pay | Admitting: *Deleted

## 2018-03-11 NOTE — Telephone Encounter (Signed)
Patient's wife calling. States patient's health is deterrioriating rapidly. States he is not eating, no appetite, if he does eat he gets nauseated. Having trouble swallowing his xtandi pills, but is thirsty all of the time He is c/o generalized pain, can't stand up by himself. His next lab and visit with dr Alen Blew is 3/18. Bone scan and ct abd/pelvis on 3/5. She would like to know if dr Alen Blew might see patient sooner. She has reached out to hospice, but was told he was not appropriate d/t taking xtandi.

## 2018-03-11 NOTE — Telephone Encounter (Signed)
He needs hospice referral. He can stop the [place].

## 2018-03-11 NOTE — Telephone Encounter (Signed)
Per dr Alen Blew, spoke with dee, patient's wife. They are to stop the xtandi and be referred to hospice. Family requests [facility] [phone]. Spoke with Lenna Sciara, faxed requested notes,labs, and med list to [phone].   bone scan and CT scan for 03/20/18 will be cancelled.

## 2018-03-13 ENCOUNTER — Telehealth: Payer: Self-pay | Admitting: *Deleted

## 2018-03-13 NOTE — Telephone Encounter (Signed)
Unable to understand patient's message. I made multiple calls trying to get patient on the phone. This has been an issue in the past. Left a message with patient's daughter Spencer Todd also.

## 2018-03-20 ENCOUNTER — Encounter (HOSPITAL_COMMUNITY): Payer: Medicare Other

## 2018-03-20 ENCOUNTER — Ambulatory Visit (HOSPITAL_COMMUNITY): Payer: Medicare Other

## 2018-03-20 ENCOUNTER — Other Ambulatory Visit (HOSPITAL_COMMUNITY): Payer: Medicare Other

## 2018-03-28 ENCOUNTER — Telehealth: Payer: Self-pay | Admitting: *Deleted

## 2018-03-28 NOTE — Telephone Encounter (Signed)
  Thank you for sharing. Very sweet man, sad to hear of his passing.

## 2018-03-28 NOTE — Telephone Encounter (Signed)
Wife called to notify Dr Alen Blew that Mr Tiffany passed on 04/04/22. Was very appreciative of all the great care he received. Special thanks to [place], in pharmacy.

## 2018-04-02 ENCOUNTER — Ambulatory Visit: Payer: Medicare Other | Admitting: Oncology

## 2018-04-02 ENCOUNTER — Other Ambulatory Visit: Payer: Medicare Other

## 2018-04-16 DEATH — deceased

## 2019-03-25 IMAGING — CT CT HEAD W/O CM
3 series · 15 of 47 positions shown, 18 images · non-contrast
Comparison: 06/23/2017

CLINICAL DATA: Right greater than left-sided weakness. Multiple
recent falls.

EXAM:
CT HEAD WITHOUT CONTRAST
TECHNIQUE: Contiguous axial images were obtained from the base of the skull
through the vertex without intravenous contrast.

[Series 2: head wo · axial · 0.45mm/px · z∈[-105,+30]mm · 9 of 33 slices shown, 12 images]
[im 3/33  brain]
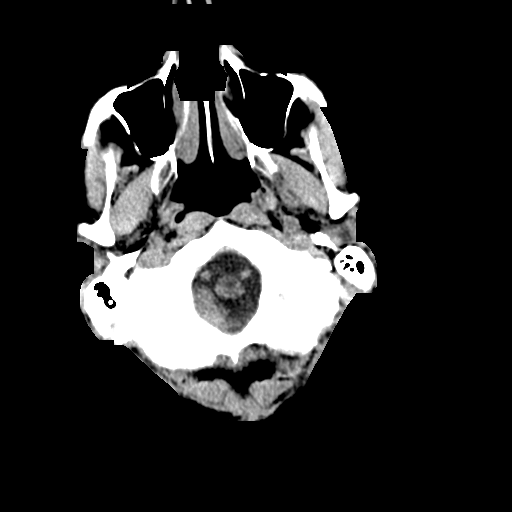
[im 3/33  bone]
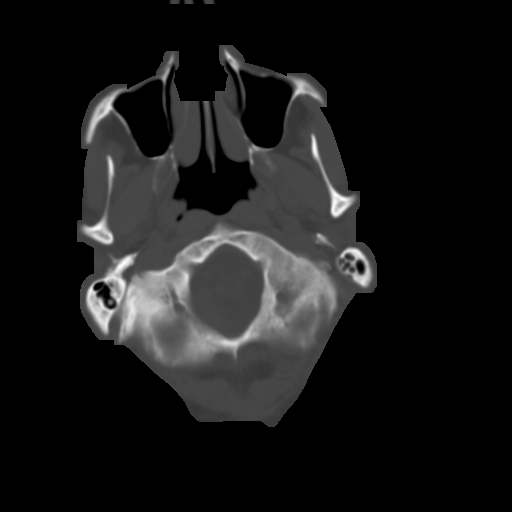
[im 6/33  brain]
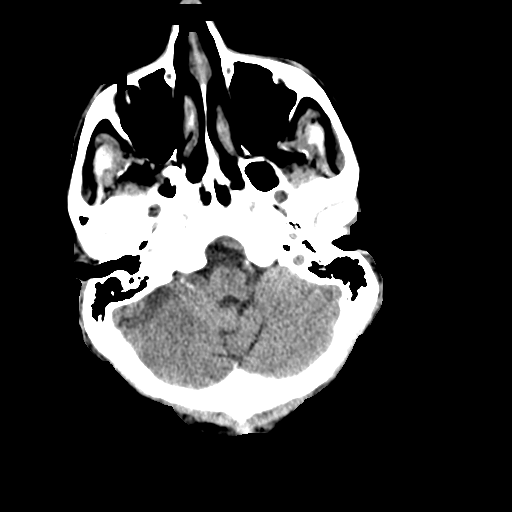
[im 9/33  brain]
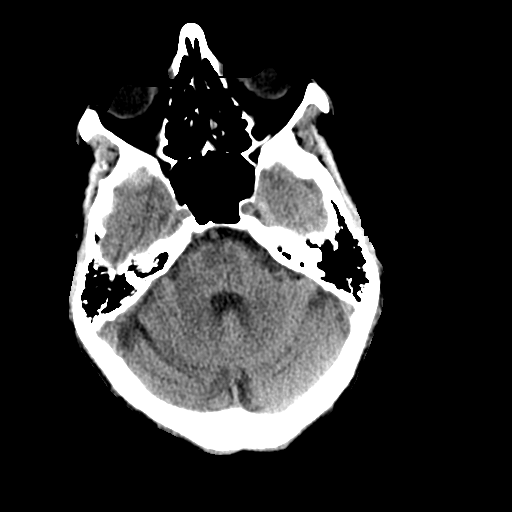
[im 13/33  brain]
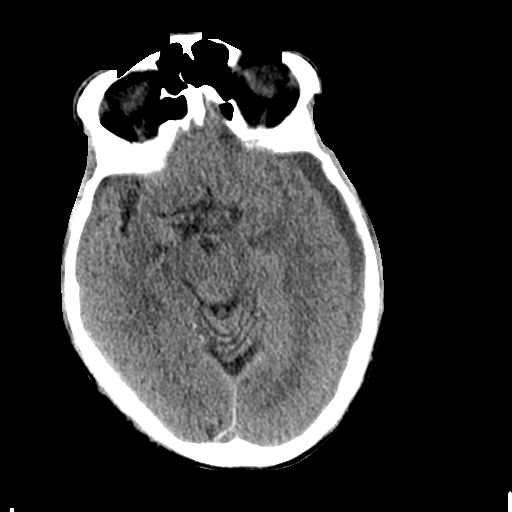
[im 17/33  brain]
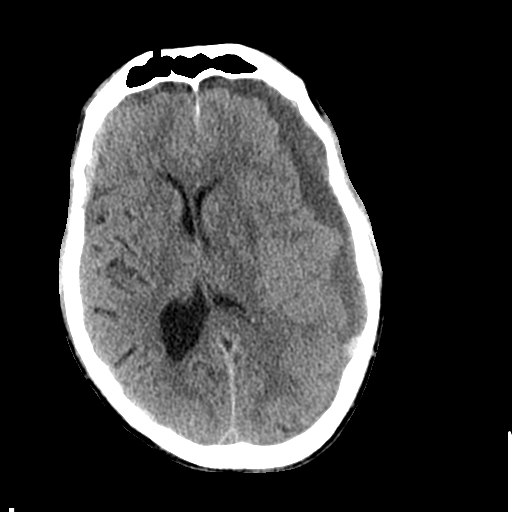
[im 17/33  bone]
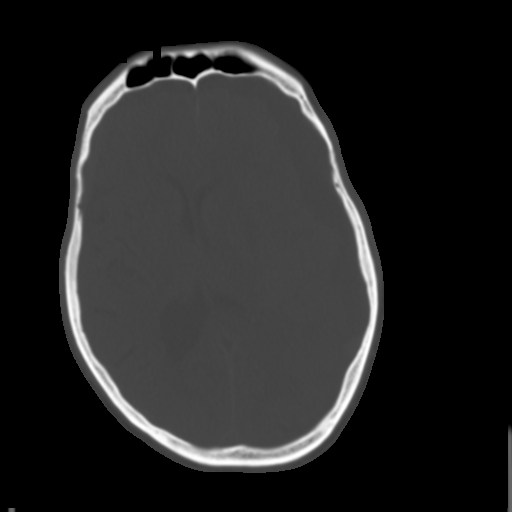
[im 20/33  brain]
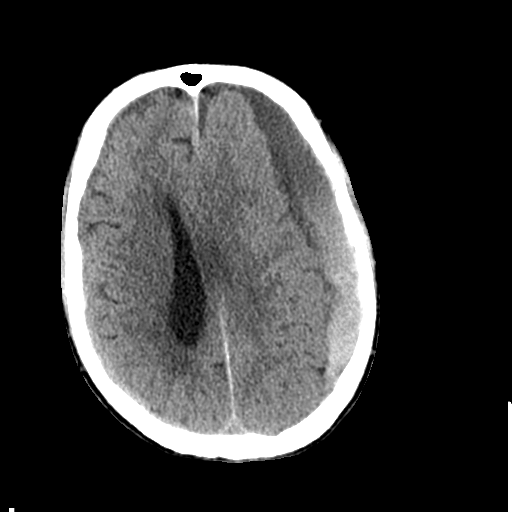
[im 24/33  brain]
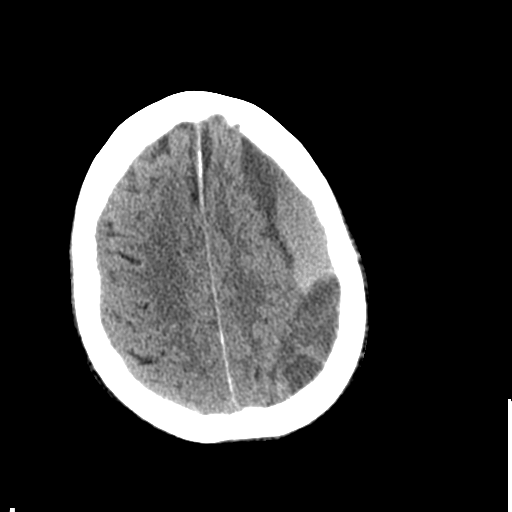
[im 27/33  brain]
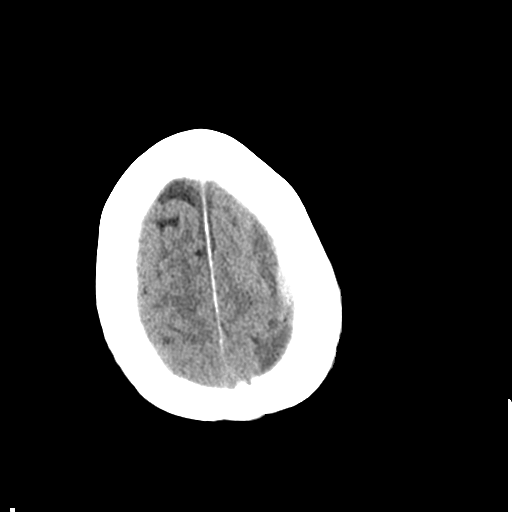
[im 30/33  brain]
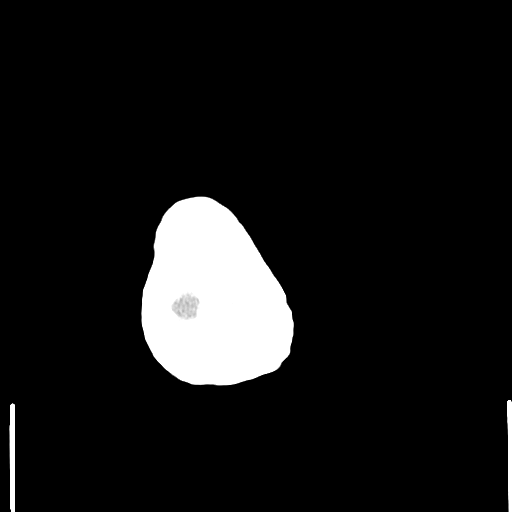
[im 30/33  bone]
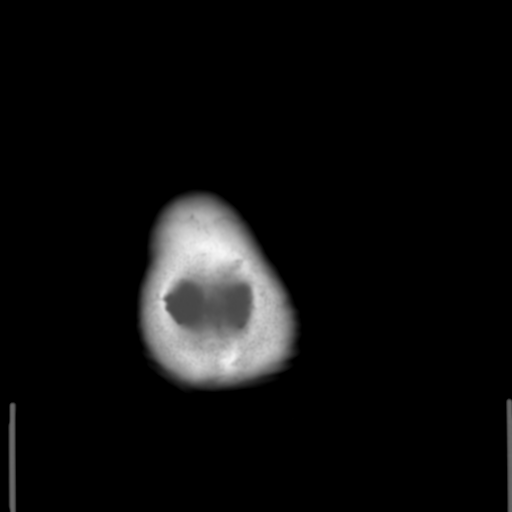

[Series 5: coronal soft tissue · coronal · 0.32mm/px · 3 of 84 slices shown]
[im 28/84  brain]
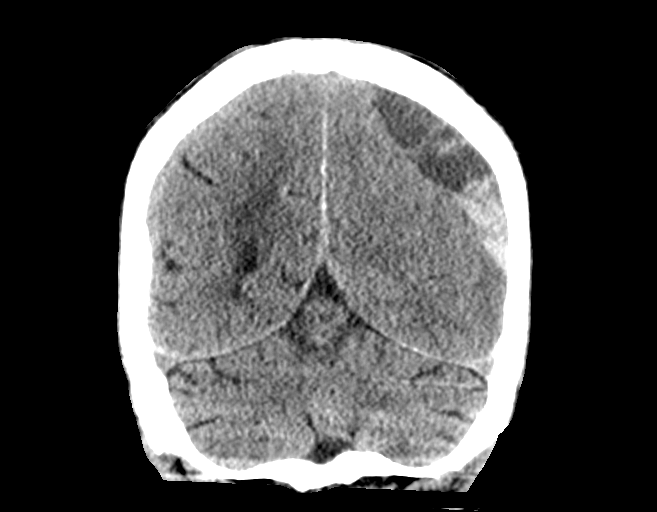
[im 37/84  brain]
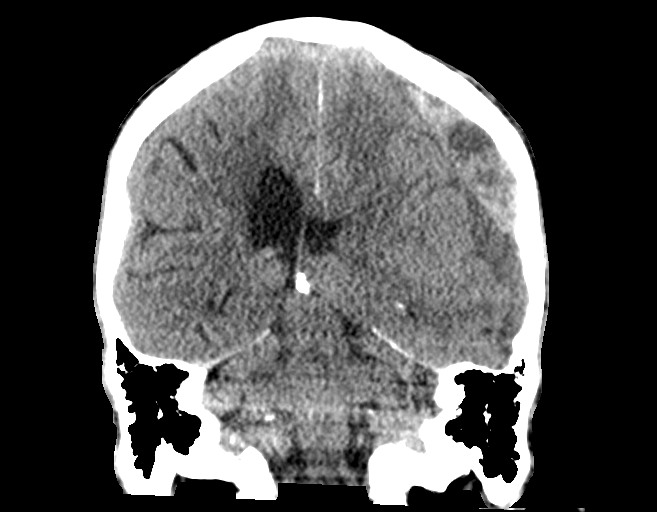
[im 47/84  brain]
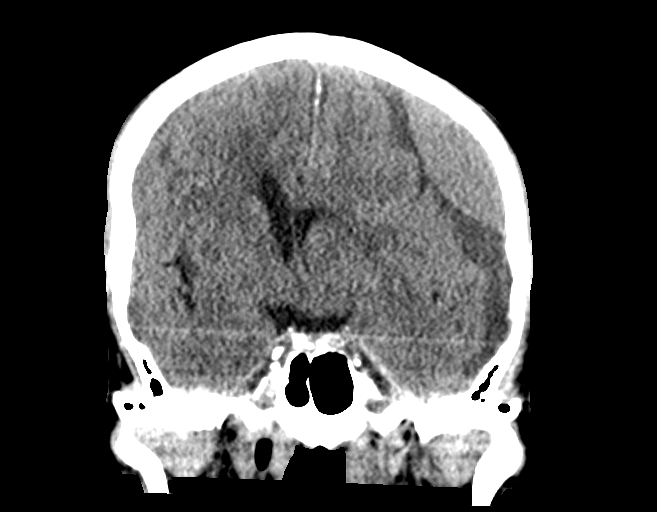

[Series 6: sagittal soft tissue · sagittal · 0.34mm/px · 3 of 62 slices shown]
[im 21/62  brain]
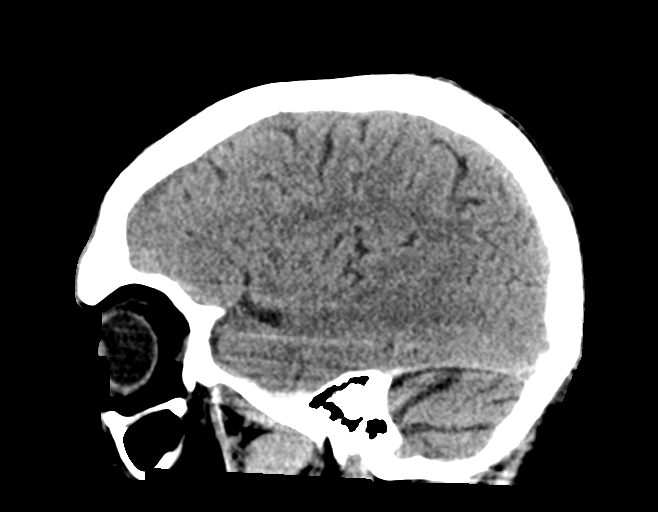
[im 31/62  brain]
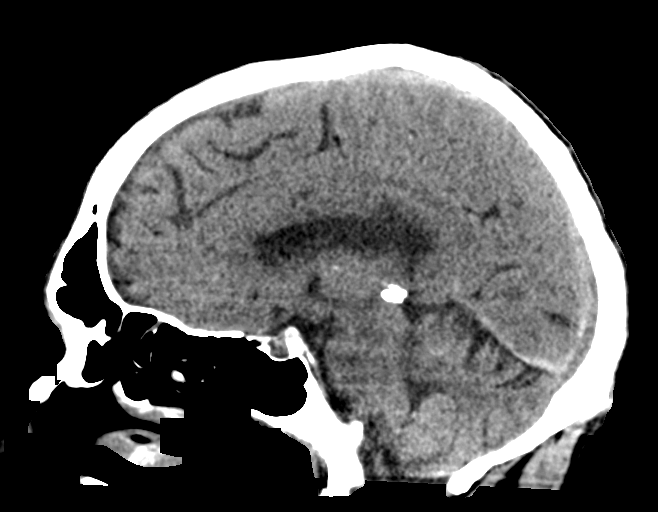
[im 41/62  brain]
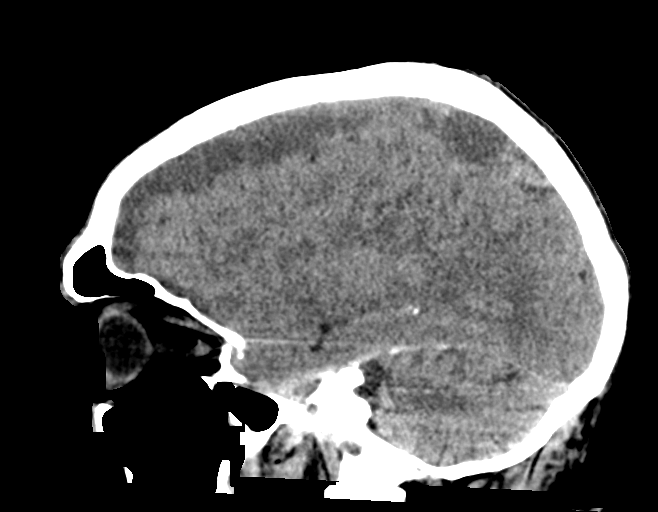

[15 of 47 positions shown; findings below may reference images not displayed]

FINDINGS: Brain: There are new mixed density subdural hematomas over both
cerebral convexities which measure up to 8 mm in thickness on the
right and 20 mm on the left. Left cerebral sulci are effaced and
there is partial effacement of the left lateral ventricle with 8 mm
of midline shift. There is no interval ventricular dilatation to
indicate acute hydrocephalus. There is no evidence of acute large
territory infarct. Cerebral white matter hypodensities are again
noted and are nonspecific but compatible with chronic small vessel
ischemic disease.

Vascular: Calcified atherosclerosis at the skull base. No hyperdense
vessel.

Skull: No fracture or focal osseous lesion.

Sinuses/Orbits: Visualized paranasal sinuses and mastoid air cells
are clear. Orbits are unremarkable.

Other: None.
IMPRESSION: New left larger than right subdural hematomas with 8 mm of rightward
midline shift.

Critical Value/emergent results were called by telephone at the time
of interpretation on 08/04/2017 at [DATE] to Dr. FERIENHAUS ERXLEBEN ,
who verbally acknowledged these results.

## 2019-03-26 IMAGING — CT CT HEAD W/O CM
4 series · 15 of 47 positions shown, 17 images · non-contrast
Comparison: Prior CT from 08/04/2017.

CLINICAL DATA: Follow-up examination for subdural hemorrhage.

EXAM:
CT HEAD WITHOUT CONTRAST
TECHNIQUE: Contiguous axial images were obtained from the base of the skull
through the vertex without intravenous contrast.

[Series 3: head wo · axial · 0.42mm/px · z∈[-140,-20]mm · 7 of 33 slices shown, 9 images]
[im 5/33  brain]
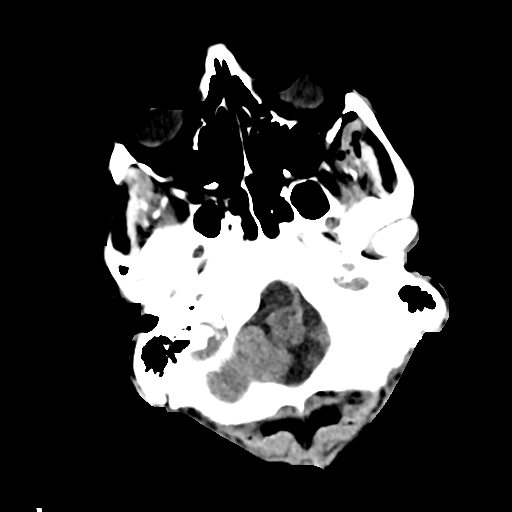
[im 5/33  bone]
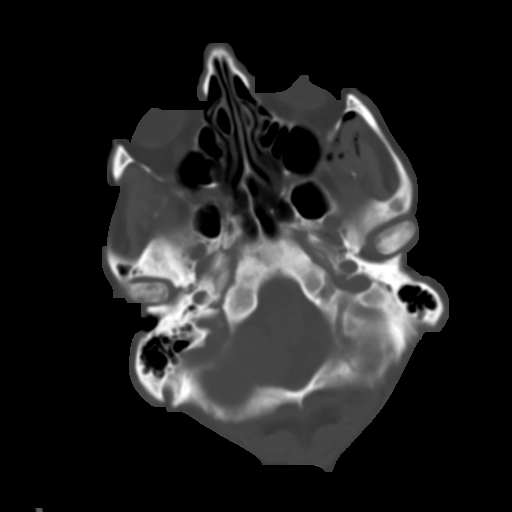
[im 9/33  brain]
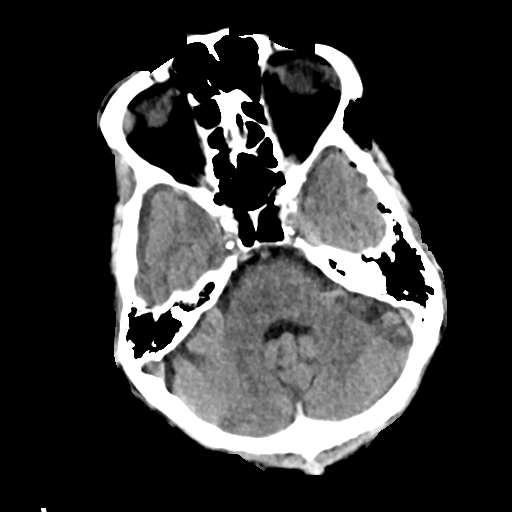
[im 13/33  brain]
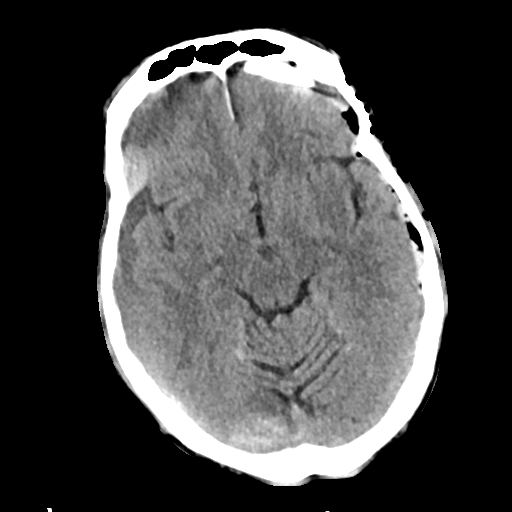
[im 17/33  brain]
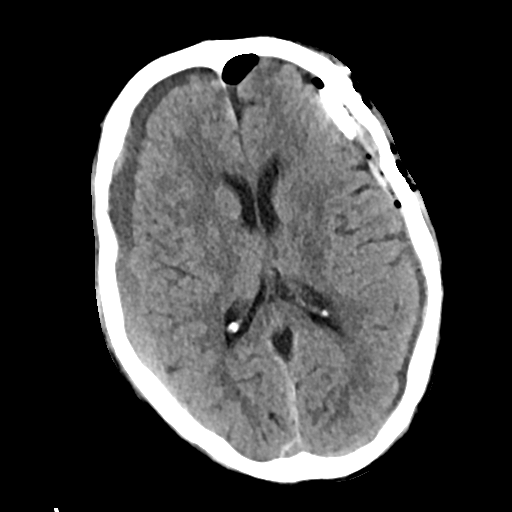
[im 21/33  brain]
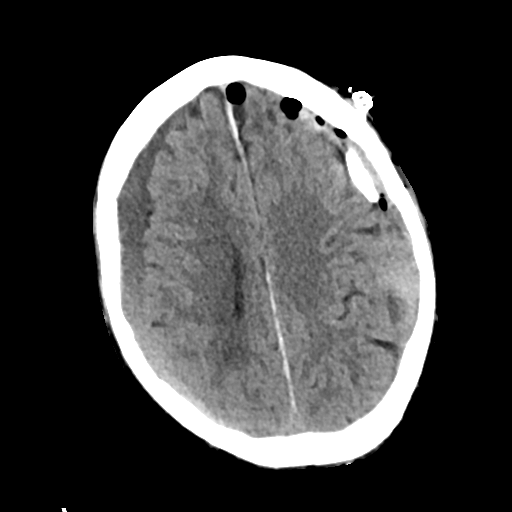
[im 21/33  bone]
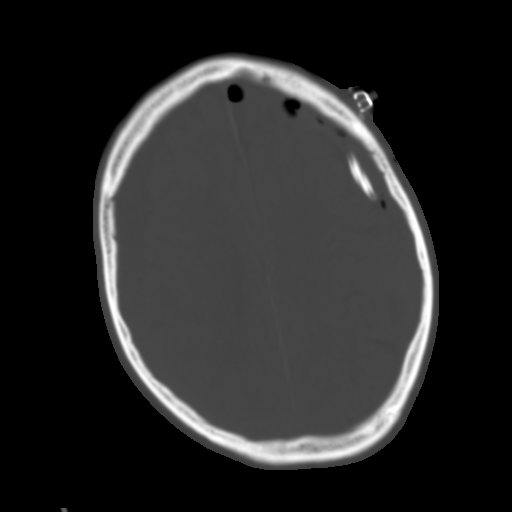
[im 25/33  brain]
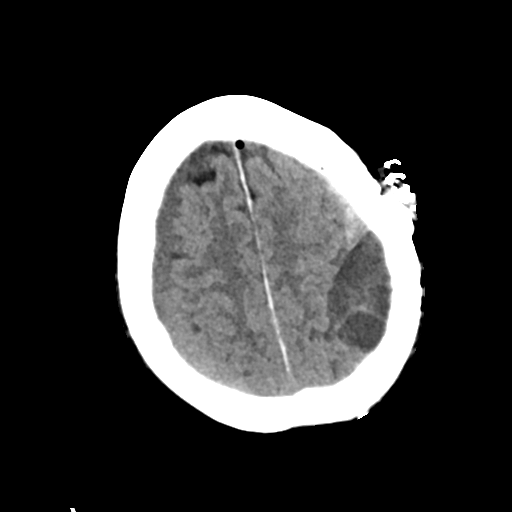
[im 29/33  brain]
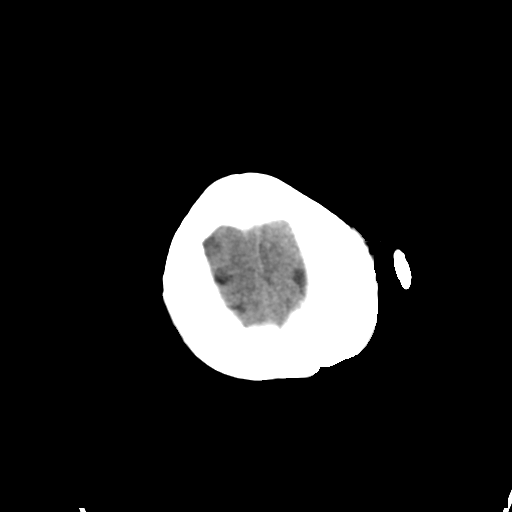

[Series 4: head bone · axial · 0.42mm/px · z∈[-144,-128]mm · 2 of 81 slices shown]
[im 9/81  bone]
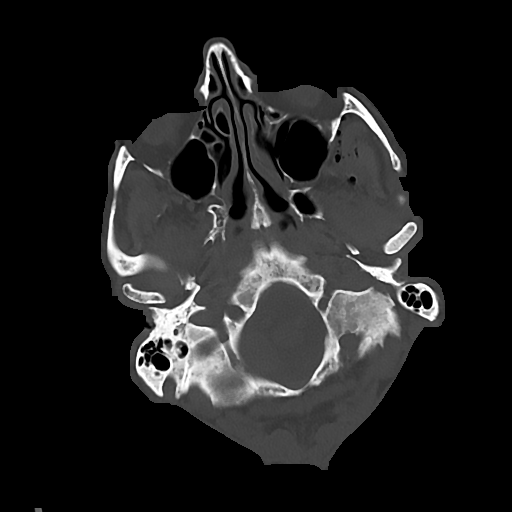
[im 17/81  bone]
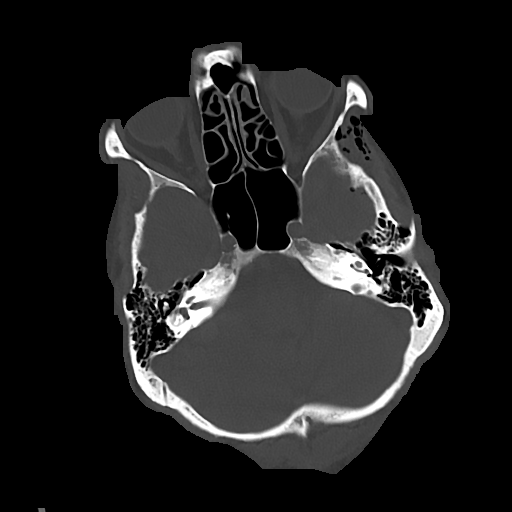

[Series 5: cor soft · coronal · 0.31mm/px · 3 of 67 slices shown]
[im 23/67  brain]
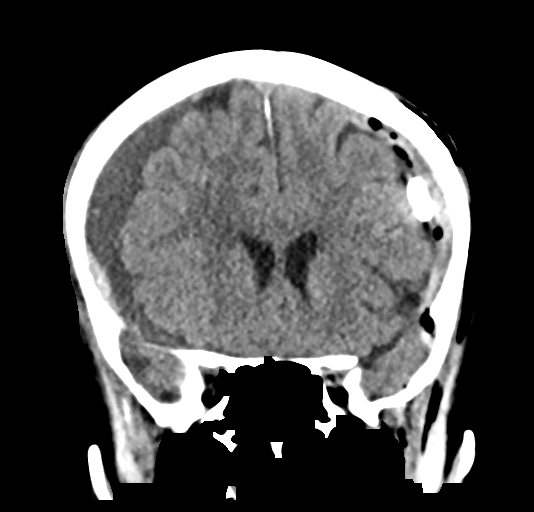
[im 30/67  brain]
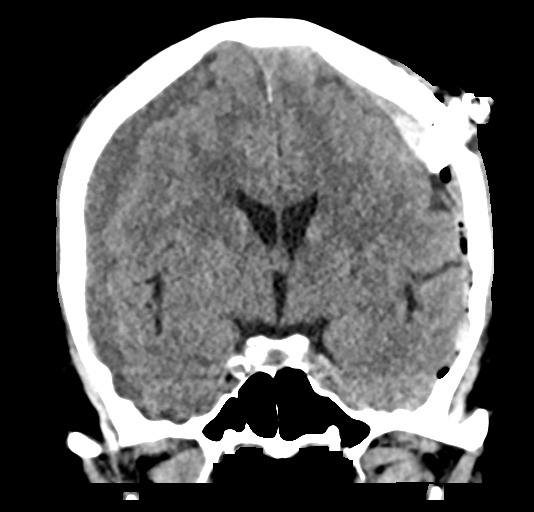
[im 37/67  brain]
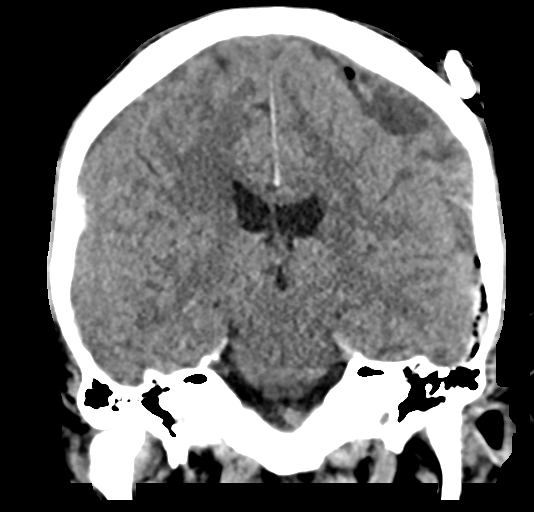

[Series 6: sag soft · sagittal · 0.31mm/px · 3 of 54 slices shown]
[im 18/54  brain]
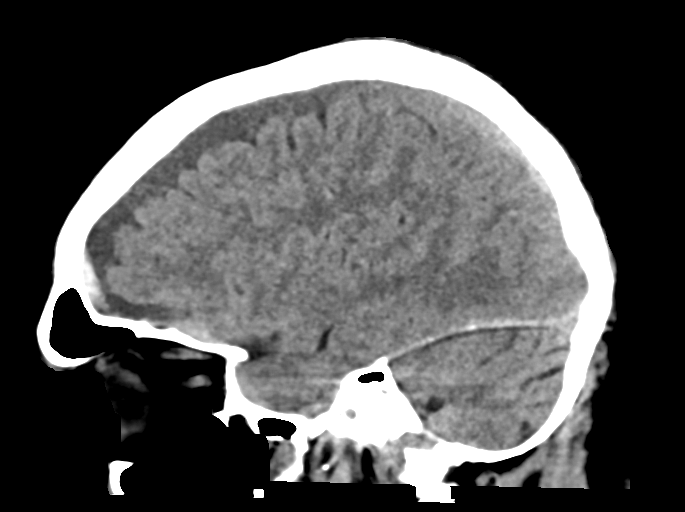
[im 27/54  brain]
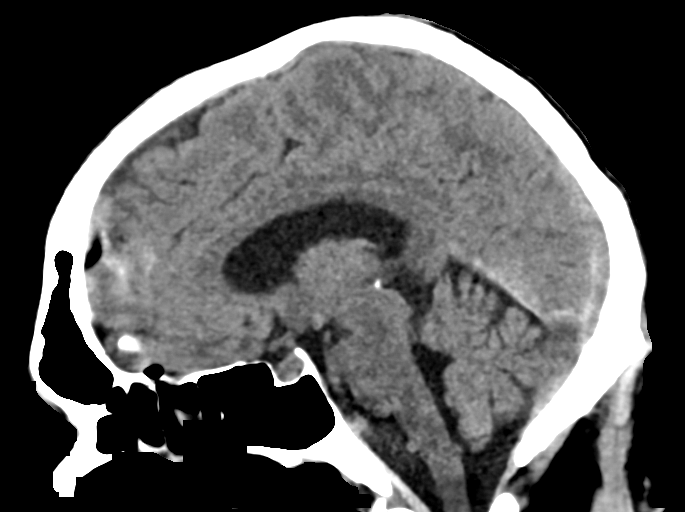
[im 36/54  brain]
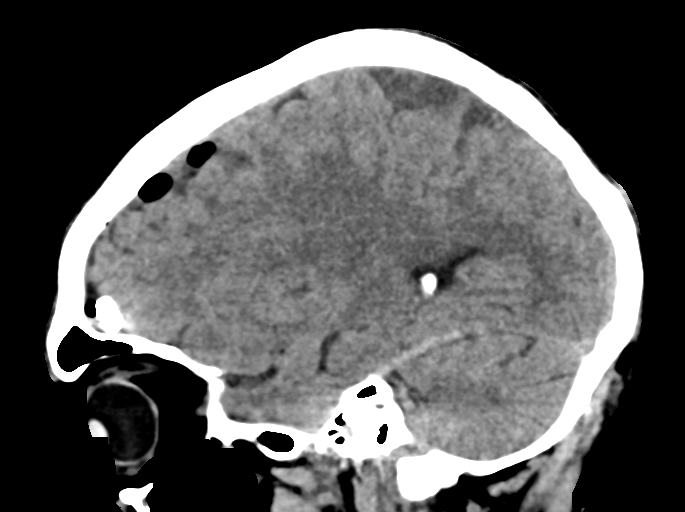

[15 of 47 positions shown; findings below may reference images not displayed]

FINDINGS: Brain: Postoperative changes from interval left frontal burr hole
craniotomy for subdural evacuation seen. Extra-axial postoperative
pneumocephalus overlies the left cerebral convexity. Subdural drain
extends via the posterior burr hole, with tip overlying the anterior
left frontal pole. Residual mixed density subdural hematoma overlies
the left frontal convexity, decreased in size now measuring up to 11
mm in maximal thickness. Loculated component noted posteriorly at
the left parietal convexity, stable. Improved mass effect on the
subjacent left cerebral hemisphere.

Mixed density right subdural hematoma is slightly increased in size
now measuring up to 11 mm, previously 8 mm. A more acute component
seen anteriorly appears slightly increased in size from previous
(series 3, image 13). Trace 2-3 mm right-to-left shift now evident.
No hydrocephalus or ventricular trapping. Basilar cisterns remain
patent.

Chronic microvascular ischemic disease again noted. No acute large
vessel territory infarct. No mass lesion.

Vascular: No hyperdense vessel. Scattered vascular calcifications
noted within the carotid siphons.

Skull: Postoperative changes from prior burr hole craniotomy at the
left frontal scalp. Skin staples in place.

Sinuses/Orbits: Globes and orbital soft tissues demonstrate no acute
finding. Mild mucosal thickening within the ethmoidal air cells.
Paranasal sinuses are otherwise clear. Mastoid air cells clear.

Other: None.
IMPRESSION: 1. Postoperative changes from interval left frontal burr hole
craniotomy for subdural evacuation. Subdural drain in place with tip
overlying the anterior left frontal pole. No adverse features.
2. Interval decrease in size of left subdural collection, now
measuring up to 11 mm in thickness with improved mass effect on the
subjacent left cerebral hemisphere.
3. Slight interval increase in size of right subdural hematoma now
measuring up to 11 mm in thickness with trace 2-3 mm right-to-left
midline shift.
